# Patient Record
Sex: Male | Born: 1955 | Race: White | Hispanic: No | Marital: Married | Smoking: Former smoker
Health system: Southern US, Community
[De-identification: ages and names within clinical notes are randomized; demographics above are authoritative.]

## PROBLEM LIST (undated history)

## (undated) DIAGNOSIS — M1990 Unspecified osteoarthritis, unspecified site: Secondary | ICD-10-CM

## (undated) DIAGNOSIS — Z95810 Presence of automatic (implantable) cardiac defibrillator: Secondary | ICD-10-CM

## (undated) DIAGNOSIS — T82190A Other mechanical complication of cardiac electrode, initial encounter: Secondary | ICD-10-CM

## (undated) DIAGNOSIS — Z9989 Dependence on other enabling machines and devices: Secondary | ICD-10-CM

## (undated) DIAGNOSIS — M5090 Cervical disc disorder, unspecified, unspecified cervical region: Secondary | ICD-10-CM

## (undated) DIAGNOSIS — K760 Fatty (change of) liver, not elsewhere classified: Secondary | ICD-10-CM

## (undated) DIAGNOSIS — N400 Enlarged prostate without lower urinary tract symptoms: Secondary | ICD-10-CM

## (undated) DIAGNOSIS — I10 Essential (primary) hypertension: Secondary | ICD-10-CM

## (undated) DIAGNOSIS — I429 Cardiomyopathy, unspecified: Secondary | ICD-10-CM

## (undated) DIAGNOSIS — R911 Solitary pulmonary nodule: Secondary | ICD-10-CM

## (undated) DIAGNOSIS — E782 Mixed hyperlipidemia: Secondary | ICD-10-CM

## (undated) DIAGNOSIS — I25119 Atherosclerotic heart disease of native coronary artery with unspecified angina pectoris: Secondary | ICD-10-CM

## (undated) DIAGNOSIS — Z129 Encounter for screening for malignant neoplasm, site unspecified: Secondary | ICD-10-CM

## (undated) DIAGNOSIS — I252 Old myocardial infarction: Secondary | ICD-10-CM

## (undated) DIAGNOSIS — I499 Cardiac arrhythmia, unspecified: Secondary | ICD-10-CM

## (undated) DIAGNOSIS — K219 Gastro-esophageal reflux disease without esophagitis: Secondary | ICD-10-CM

## (undated) DIAGNOSIS — J449 Chronic obstructive pulmonary disease, unspecified: Secondary | ICD-10-CM

## (undated) DIAGNOSIS — G4762 Sleep related leg cramps: Secondary | ICD-10-CM

## (undated) DIAGNOSIS — I2510 Atherosclerotic heart disease of native coronary artery without angina pectoris: Secondary | ICD-10-CM

## (undated) DIAGNOSIS — I493 Ventricular premature depolarization: Secondary | ICD-10-CM

## (undated) DIAGNOSIS — E1142 Type 2 diabetes mellitus with diabetic polyneuropathy: Secondary | ICD-10-CM

## (undated) DIAGNOSIS — E669 Obesity, unspecified: Secondary | ICD-10-CM

## (undated) DIAGNOSIS — E785 Hyperlipidemia, unspecified: Secondary | ICD-10-CM

## (undated) DIAGNOSIS — G4733 Obstructive sleep apnea (adult) (pediatric): Secondary | ICD-10-CM

## (undated) DIAGNOSIS — D649 Anemia, unspecified: Secondary | ICD-10-CM

## (undated) DIAGNOSIS — I5022 Chronic systolic (congestive) heart failure: Secondary | ICD-10-CM

## (undated) DIAGNOSIS — I255 Ischemic cardiomyopathy: Principal | ICD-10-CM

## (undated) DIAGNOSIS — Z79899 Other long term (current) drug therapy: Secondary | ICD-10-CM

## (undated) DIAGNOSIS — G4700 Insomnia, unspecified: Secondary | ICD-10-CM

## (undated) DIAGNOSIS — E559 Vitamin D deficiency, unspecified: Secondary | ICD-10-CM

## (undated) DIAGNOSIS — Z8674 Personal history of sudden cardiac arrest: Secondary | ICD-10-CM

## (undated) HISTORY — DX: Anemia, unspecified: D64.9

## (undated) HISTORY — PX: CARDIAC CATHETERIZATION: SHX172

## (undated) HISTORY — DX: Fatty (change of) liver, not elsewhere classified: K76.0

## (undated) HISTORY — DX: Hyperlipidemia, unspecified: E78.5

## (undated) HISTORY — DX: Cervical disc disorder, unspecified, unspecified cervical region: M50.90

## (undated) HISTORY — DX: Encounter for screening for malignant neoplasm, site unspecified: Z12.9

## (undated) HISTORY — DX: Benign prostatic hyperplasia without lower urinary tract symptoms: N40.0

## (undated) HISTORY — DX: Ischemic cardiomyopathy: I25.5

## (undated) HISTORY — DX: Type 2 diabetes mellitus with diabetic polyneuropathy: E11.42

## (undated) HISTORY — DX: Atherosclerotic heart disease of native coronary artery without angina pectoris: I25.10

## (undated) HISTORY — DX: Other mechanical complication of cardiac electrode, initial encounter: T82.190A

## (undated) HISTORY — DX: Atherosclerotic heart disease of native coronary artery with unspecified angina pectoris: I25.119

## (undated) HISTORY — DX: Other long term (current) drug therapy: Z79.899

## (undated) HISTORY — DX: Mixed hyperlipidemia: E78.2

## (undated) HISTORY — DX: Presence of automatic (implantable) cardiac defibrillator: Z95.810

## (undated) HISTORY — DX: Chronic obstructive pulmonary disease, unspecified: J44.9

## (undated) HISTORY — DX: Obstructive sleep apnea (adult) (pediatric): G47.33

## (undated) HISTORY — DX: Cardiomyopathy, unspecified: I42.9

## (undated) HISTORY — DX: Ventricular premature depolarization: I49.3

## (undated) HISTORY — DX: Dependence on other enabling machines and devices: Z99.89

## (undated) HISTORY — DX: Personal history of sudden cardiac arrest: Z86.74

## (undated) HISTORY — PX: INSERT / REPLACE / REMOVE PACEMAKER: SUR710

## (undated) HISTORY — DX: Insomnia, unspecified: G47.00

## (undated) HISTORY — DX: Sleep related leg cramps: G47.62

## (undated) HISTORY — DX: Obesity, unspecified: E66.9

## (undated) HISTORY — DX: Essential (primary) hypertension: I10

## (undated) HISTORY — DX: Cardiac arrhythmia, unspecified: I49.9

## (undated) HISTORY — DX: Chronic systolic (congestive) heart failure: I50.22

## (undated) HISTORY — DX: Vitamin D deficiency, unspecified: E55.9

## (undated) HISTORY — DX: Gastro-esophageal reflux disease without esophagitis: K21.9

## (undated) HISTORY — PX: UMBILICAL HERNIA REPAIR: SHX196

## (undated) HISTORY — DX: Old myocardial infarction: I25.2

## (undated) HISTORY — PX: KNEE SURGERY: SHX244

## (undated) HISTORY — DX: Unspecified osteoarthritis, unspecified site: M19.90

## (undated) HISTORY — DX: Solitary pulmonary nodule: R91.1

---

## 2007-03-23 DIAGNOSIS — I5040 Unspecified combined systolic (congestive) and diastolic (congestive) heart failure: Secondary | ICD-10-CM

## 2007-03-23 HISTORY — DX: Unspecified combined systolic (congestive) and diastolic (congestive) heart failure: I50.40

## 2014-04-16 ENCOUNTER — Ambulatory Visit (INDEPENDENT_AMBULATORY_CARE_PROVIDER_SITE_OTHER): Payer: Commercial Managed Care - HMO | Admitting: Internal Medicine

## 2014-04-16 ENCOUNTER — Encounter: Payer: Self-pay | Admitting: Internal Medicine

## 2014-04-16 VITALS — BP 100/68 | HR 81 | Ht 72.0 in | Wt 244.6 lb

## 2014-04-16 DIAGNOSIS — G4733 Obstructive sleep apnea (adult) (pediatric): Secondary | ICD-10-CM

## 2014-04-16 DIAGNOSIS — I2510 Atherosclerotic heart disease of native coronary artery without angina pectoris: Secondary | ICD-10-CM | POA: Diagnosis not present

## 2014-04-16 DIAGNOSIS — Z6831 Body mass index (BMI) 31.0-31.9, adult: Secondary | ICD-10-CM | POA: Insufficient documentation

## 2014-04-16 DIAGNOSIS — Z95810 Presence of automatic (implantable) cardiac defibrillator: Secondary | ICD-10-CM

## 2014-04-16 DIAGNOSIS — I255 Ischemic cardiomyopathy: Secondary | ICD-10-CM | POA: Diagnosis not present

## 2014-04-16 DIAGNOSIS — I110 Hypertensive heart disease with heart failure: Secondary | ICD-10-CM | POA: Insufficient documentation

## 2014-04-16 DIAGNOSIS — Z8674 Personal history of sudden cardiac arrest: Secondary | ICD-10-CM | POA: Diagnosis not present

## 2014-04-16 DIAGNOSIS — I25119 Atherosclerotic heart disease of native coronary artery with unspecified angina pectoris: Secondary | ICD-10-CM | POA: Insufficient documentation

## 2014-04-16 DIAGNOSIS — I10 Essential (primary) hypertension: Secondary | ICD-10-CM

## 2014-04-16 DIAGNOSIS — E785 Hyperlipidemia, unspecified: Secondary | ICD-10-CM

## 2014-04-16 DIAGNOSIS — E669 Obesity, unspecified: Secondary | ICD-10-CM

## 2014-04-16 DIAGNOSIS — E66811 Obesity, class 1: Secondary | ICD-10-CM

## 2014-04-16 DIAGNOSIS — Z9989 Dependence on other enabling machines and devices: Secondary | ICD-10-CM

## 2014-04-16 DIAGNOSIS — I2583 Coronary atherosclerosis due to lipid rich plaque: Secondary | ICD-10-CM

## 2014-04-16 HISTORY — DX: Presence of automatic (implantable) cardiac defibrillator: Z95.810

## 2014-04-16 HISTORY — DX: Atherosclerotic heart disease of native coronary artery without angina pectoris: I25.10

## 2014-04-16 HISTORY — DX: Obesity, unspecified: E66.9

## 2014-04-16 HISTORY — DX: Obstructive sleep apnea (adult) (pediatric): G47.33

## 2014-04-16 HISTORY — DX: Personal history of sudden cardiac arrest: Z86.74

## 2014-04-16 HISTORY — DX: Hypertensive heart disease with heart failure: I11.0

## 2014-04-16 HISTORY — DX: Ischemic cardiomyopathy: I25.5

## 2014-04-16 HISTORY — DX: Atherosclerotic heart disease of native coronary artery with unspecified angina pectoris: I25.119

## 2014-04-16 HISTORY — DX: Obesity, class 1: E66.811

## 2014-04-16 HISTORY — DX: Essential (primary) hypertension: I10

## 2014-04-16 HISTORY — DX: Hyperlipidemia, unspecified: E78.5

## 2014-04-16 NOTE — Progress Notes (Signed)
OFFICE NOTE  Chief Complaint:  Establish new cardiologist  Primary Care Physician: Feliciana RossettiGRISSO,GREG, MD  HPI:  Austin Bush is a pleasant 59 year old male who was previously cared for by Dr. Dulce SellarMunley and Dr. Rudolpho SevinAkbary at [facility]. Per his report he has a history of coronary artery disease and had a massive MI in June 2009. He called this a "widow maker". He has a history of one stent presumably in the LAD although past records are not yet available. He reports having a reduced EF to 25% based on a recent stress test about 8 months ago. He underwent defibrillator placement and has had problems with inappropriate shocks. He's had 2-lead replacements in the past and a generator change. He is currently going to undergo another generator change out and lead replacement, with a plan to move the defibrilator device from the left upper chest to the right upper chest. He is left-handed. Currently he has a Biotronik device, however he may be switching out to a [employer] development officerdifferent manufacturer. This scheduled generator change out is next Monday. Subsequent to this generator change and lead revision, he wishes to establish cardiac vascular care with [facility]. Currently denies any chest pain or worsening shortness of breath. He reports his home dry weight is 238 pounds. He denies any new or worsening edema. He does have obstructive sleep apnea and uses CPAP.  PMHx:  Past Medical History  Diagnosis Date  . Cardiomyopathy, ischemic 04/16/2014    EF 25%   . History of sudden cardiac arrest 04/16/2014    08/2007   . CAD (coronary artery disease) 04/16/2014    PCI to the LAD   . S/P implantation of automatic cardioverter/defibrillator (AICD) 04/16/2014    Biotronik - history of inappropriate shocks, at least 2 lead replacements   . Dyslipidemia 04/16/2014  . Essential hypertension 04/16/2014  . Obesity (BMI 30.0-34.9) 04/16/2014  . OSA on CPAP 04/16/2014    Past Surgical History    Procedure Laterality Date  . Cardiac catheterization    . Insert / replace / remove pacemaker      Biotronik AICD    FAMHx:  Family History  Problem Relation Age of Onset  . Lung cancer Father   . Heart attack Paternal Grandfather     SOCHx:   reports that he has quit smoking. He has never used smokeless tobacco. He reports that he does not drink alcohol. His drug history is not on file.  ALLERGIES:  No Known Allergies  ROS: A comprehensive review of systems was negative except for: Respiratory: positive for dyspnea on exertion  HOME MEDS: Current Outpatient Prescriptions  Medication Sig Dispense Refill  . aspirin 81 MG tablet Take 81 mg by mouth daily.    . carvedilol (COREG) 25 MG tablet Take 25 mg by mouth 2 (two) times daily with a meal.    . Cholecalciferol 1000 UNITS capsule Take 1,000 Units by mouth daily.    . furosemide (LASIX) 40 MG tablet Take 40 mg by mouth 2 (two) times daily.    Marland Kitchen. gabapentin (NEURONTIN) 400 MG capsule Take 400 mg by mouth 3 (three) times daily.  1  . ketoconazole (NIZORAL) 2 % cream Apply topically 2 (two) times daily.  2  . lisinopril (PRINIVIL,ZESTRIL) 20 MG tablet Take 20 mg by mouth 2 (two) times daily.    . metFORMIN (GLUCOPHAGE) 500 MG tablet Take 500 mg by mouth 2 (two) times daily.    . Multiple Vitamin (MULTIVITAMIN) tablet  Take 1 tablet by mouth daily.    . Omega-3 Fatty Acids (FISH OIL) 1200 MG CPDR Take 1,200 mg by mouth 3 (three) times daily.    . potassium chloride SA (K-DUR,KLOR-CON) 20 MEQ tablet Take 20 mEq by mouth 2 (two) times daily.    . pravastatin (PRAVACHOL) 40 MG tablet Take 40 mg by mouth daily.     No current facility-administered medications for this visit.    LABS/IMAGING: No results found for this or any previous visit (from the past 48 hour(s)). No results found.  VITALS: BP 100/68 mmHg  Pulse 81  Ht 6' (1.829 m)  Wt 244 lb 9.6 oz (110.95 kg)  BMI 33.17 kg/m2  EXAM: General appearance: alert and no  distress Neck: no carotid bruit and no JVD Lungs: clear to auscultation bilaterally Heart: regular rate and rhythm, S1, S2 normal, no murmur, click, rub or gallop Abdomen: soft, non-tender; bowel sounds normal; no masses,  no organomegaly and obese Extremities: extremities normal, atraumatic, no cyanosis or edema Pulses: 2+ and symmetric Skin: Skin color, texture, turgor normal. No rashes or lesions Neurologic: Grossly normal Psych: Pleasant, normal mood, affect  EKG: Normal sinus rhythm at 81, small inferior Q waves  ASSESSMENT: 1. Ischemic cardiopathy, EF 25%, NYHA class II symptoms 2. Coronary artery disease status post PCI of the LAD 3. Status post AICD for aborted sudden cardiac death-Biotronik 4. Hypertension 5. Dyslipidemia 6. Obstructive sleep apnea on CPAP 7. Obesity  PLAN: 1.   [name] wishes to establish care with [facility]. Recent defibrillator interrogations of indicated problems with the leads and he may end up getting new leads and/or a new device in a change her procedure next Monday. He wishes to stick with that scheduled appointment and then switches service over our group. He appears to be euvolemic with at best class II symptoms. His current medication regimen is appropriate for heart failure. He is not currently on Aldactone, which may additionally be added to decrease mortality and improved symptoms as per the RALES trial.  We will go ahead and request records from his cardiologist and electrophysiologist and schedule him for follow-up in our device clinic as well as an appointment to see Dr. Salena Saner for ongoing evaluation of his new device.  Chrystie Nose, MD, [facility] Attending Cardiologist [facility]  Tashawnda Bleiler C 04/16/2014, 11:46 AM

## 2014-04-16 NOTE — Patient Instructions (Signed)
Your physician recommends that you schedule a follow-up appointment in 3 months with Dr. Royann Shiversroitoru for device clinic  Your physician wants you to follow-up in: 6 months with Dr. Rennis GoldenHilty. You will receive a reminder letter in the mail two months in advance. If you don't receive a letter, please call our office to schedule the follow-up appointment.

## 2014-04-24 ENCOUNTER — Telehealth: Payer: Self-pay | Admitting: Internal Medicine

## 2014-04-24 NOTE — Telephone Encounter (Signed)
Faxed signed release from patient to [facility] in [place] and [facility] in [place].  FAxed on 04/24/14. lp

## 2014-05-02 ENCOUNTER — Ambulatory Visit: Payer: Self-pay | Admitting: Internal Medicine

## 2014-05-15 ENCOUNTER — Telehealth: Payer: Self-pay | Admitting: Cardiovascular Disease

## 2014-05-15 NOTE — Telephone Encounter (Signed)
Received records from [facility] for appointment with Dr Royann Shiversroitoru 07/16/14.  [facility] for Dr Croitoru's schedule on 07/16/14.  lp

## 2014-07-16 ENCOUNTER — Encounter: Payer: Medicare HMO | Admitting: Cardiovascular Disease

## 2014-12-12 DIAGNOSIS — I429 Cardiomyopathy, unspecified: Secondary | ICD-10-CM | POA: Insufficient documentation

## 2014-12-12 HISTORY — DX: Cardiomyopathy, unspecified: I42.9

## 2015-04-23 DIAGNOSIS — E785 Hyperlipidemia, unspecified: Secondary | ICD-10-CM

## 2015-04-23 DIAGNOSIS — M1710 Unilateral primary osteoarthritis, unspecified knee: Secondary | ICD-10-CM

## 2015-04-23 DIAGNOSIS — R911 Solitary pulmonary nodule: Secondary | ICD-10-CM

## 2015-04-23 DIAGNOSIS — T82190A Other mechanical complication of cardiac electrode, initial encounter: Secondary | ICD-10-CM

## 2015-04-23 DIAGNOSIS — K635 Polyp of colon: Secondary | ICD-10-CM

## 2015-04-23 DIAGNOSIS — M50122 Cervical disc disorder at C5-C6 level with radiculopathy: Secondary | ICD-10-CM

## 2015-04-23 DIAGNOSIS — J449 Chronic obstructive pulmonary disease, unspecified: Secondary | ICD-10-CM | POA: Insufficient documentation

## 2015-04-23 DIAGNOSIS — M1990 Unspecified osteoarthritis, unspecified site: Secondary | ICD-10-CM | POA: Insufficient documentation

## 2015-04-23 DIAGNOSIS — N400 Enlarged prostate without lower urinary tract symptoms: Secondary | ICD-10-CM

## 2015-04-23 DIAGNOSIS — Z129 Encounter for screening for malignant neoplasm, site unspecified: Secondary | ICD-10-CM | POA: Insufficient documentation

## 2015-04-23 DIAGNOSIS — M5090 Cervical disc disorder, unspecified, unspecified cervical region: Secondary | ICD-10-CM

## 2015-04-23 DIAGNOSIS — Z95810 Presence of automatic (implantable) cardiac defibrillator: Secondary | ICD-10-CM

## 2015-04-23 DIAGNOSIS — K219 Gastro-esophageal reflux disease without esophagitis: Secondary | ICD-10-CM | POA: Insufficient documentation

## 2015-04-23 DIAGNOSIS — M179 Osteoarthritis of knee, unspecified: Secondary | ICD-10-CM

## 2015-04-23 DIAGNOSIS — I5022 Chronic systolic (congestive) heart failure: Secondary | ICD-10-CM

## 2015-04-23 DIAGNOSIS — E559 Vitamin D deficiency, unspecified: Secondary | ICD-10-CM

## 2015-04-23 DIAGNOSIS — G629 Polyneuropathy, unspecified: Secondary | ICD-10-CM

## 2015-04-23 DIAGNOSIS — Z79899 Other long term (current) drug therapy: Secondary | ICD-10-CM

## 2015-04-23 DIAGNOSIS — G4700 Insomnia, unspecified: Secondary | ICD-10-CM

## 2015-04-23 DIAGNOSIS — K760 Fatty (change of) liver, not elsewhere classified: Secondary | ICD-10-CM

## 2015-04-23 DIAGNOSIS — I499 Cardiac arrhythmia, unspecified: Secondary | ICD-10-CM

## 2015-04-23 HISTORY — DX: Other mechanical complication of cardiac electrode, initial encounter: T82.190A

## 2015-04-23 HISTORY — DX: Polyneuropathy, unspecified: G62.9

## 2015-04-23 HISTORY — DX: Benign prostatic hyperplasia without lower urinary tract symptoms: N40.0

## 2015-04-23 HISTORY — DX: Unspecified osteoarthritis, unspecified site: M19.90

## 2015-04-23 HISTORY — DX: Cervical disc disorder, unspecified, unspecified cervical region: M50.90

## 2015-04-23 HISTORY — DX: Vitamin D deficiency, unspecified: E55.9

## 2015-04-23 HISTORY — DX: Chronic systolic (congestive) heart failure: I50.22

## 2015-04-23 HISTORY — DX: Other long term (current) drug therapy: Z79.899

## 2015-04-23 HISTORY — DX: Insomnia, unspecified: G47.00

## 2015-04-23 HISTORY — DX: Polyp of colon: K63.5

## 2015-04-23 HISTORY — DX: Cervical disc disorder at C5-C6 level with radiculopathy: M50.122

## 2015-04-23 HISTORY — DX: Solitary pulmonary nodule: R91.1

## 2015-04-23 HISTORY — DX: Encounter for screening for malignant neoplasm, site unspecified: Z12.9

## 2015-04-23 HISTORY — DX: Unilateral primary osteoarthritis, unspecified knee: M17.10

## 2015-04-23 HISTORY — DX: Chronic obstructive pulmonary disease, unspecified: J44.9

## 2015-04-23 HISTORY — DX: Hyperlipidemia, unspecified: E78.5

## 2015-04-23 HISTORY — DX: Gastro-esophageal reflux disease without esophagitis: K21.9

## 2015-04-23 HISTORY — DX: Osteoarthritis of knee, unspecified: M17.9

## 2015-04-23 HISTORY — DX: Fatty (change of) liver, not elsewhere classified: K76.0

## 2015-04-23 HISTORY — DX: Presence of automatic (implantable) cardiac defibrillator: Z95.810

## 2015-04-23 HISTORY — DX: Cardiac arrhythmia, unspecified: I49.9

## 2015-04-24 DIAGNOSIS — E1142 Type 2 diabetes mellitus with diabetic polyneuropathy: Secondary | ICD-10-CM

## 2015-04-24 HISTORY — DX: Type 2 diabetes mellitus with diabetic polyneuropathy: E11.42

## 2015-05-20 DIAGNOSIS — I5042 Chronic combined systolic (congestive) and diastolic (congestive) heart failure: Secondary | ICD-10-CM

## 2015-05-20 HISTORY — DX: Chronic combined systolic (congestive) and diastolic (congestive) heart failure: I50.42

## 2015-09-15 ENCOUNTER — Ambulatory Visit (INDEPENDENT_AMBULATORY_CARE_PROVIDER_SITE_OTHER): Payer: Medicare HMO | Admitting: Podiatry

## 2015-09-15 ENCOUNTER — Ambulatory Visit (INDEPENDENT_AMBULATORY_CARE_PROVIDER_SITE_OTHER): Payer: Medicare HMO

## 2015-09-15 ENCOUNTER — Encounter: Payer: Self-pay | Admitting: Podiatry

## 2015-09-15 VITALS — BP 110/60 | HR 90 | Resp 14

## 2015-09-15 DIAGNOSIS — R52 Pain, unspecified: Secondary | ICD-10-CM | POA: Diagnosis not present

## 2015-09-15 DIAGNOSIS — M722 Plantar fascial fibromatosis: Secondary | ICD-10-CM | POA: Diagnosis not present

## 2015-09-15 MED ORDER — MELOXICAM 15 MG PO TABS: 15.0000 mg | ORAL_TABLET | Freq: Every day | ORAL | Status: DC

## 2015-09-15 NOTE — Progress Notes (Signed)
   Subjective:    Patient ID: Austin Bush, male    DOB: 08/30/1955, 60 y.o.   MRN: [id number]  HPI    Review of Systems  All other systems reviewed and are negative.      Objective:   Physical Exam        Assessment & Plan:

## 2015-09-15 NOTE — Progress Notes (Signed)
Subjective:     Patient ID: Austin Bush, male   DOB: 03/17/1956, 60 y.o.   MRN: [id number]  HPI this patient presents the office with chief complaint of pain in the bottom of both feet and through his feet for the last 2 years. He says the pain seems to be worsening and he still works and stands at work. He says the pain extends from the heel all the way to the forefoot where he is also experiences pain and discomfort. He has provided no self treatment but did present to good feet and says the insoles there  were helpful.  This patient is diabetic and he takes metformin and gabapentin for neuropathy. He presents concerned about his pain and believes and questions if it is neuropathy pain that he's been experiencing presents the office today for an evaluation and treatment of his feet   Review of Systems     Objective:   Physical Exam GENERAL APPEARANCE: Alert, conversant. Appropriately groomed. No acute distress.  VASCULAR: Pedal pulses are  palpable at  [facility] and PT bilateral.  Capillary refill time is immediate to all digits,  Normal temperature gradient.  Digital hair growth is present bilateral  NEUROLOGIC: sensation is normal to 5.07 monofilament at 5/5 sites bilateral.  Light touch is intact bilateral, Muscle strength normal.  MUSCULOSKELETAL: acceptable muscle strength, tone and stability bilateral.  Intrinsic muscluature intact bilateral.  Rectus appearance of foot and digits noted bilateral. Palpable pain through the arch of both feet.  HAV 1st MPJ  B/L  DERMATOLOGIC: skin color, texture, and turgor are within normal limits.  No preulcerative lesions or ulcers  are seen, no interdigital maceration noted.  No open lesions present.  Digital nails are asymptomatic. No drainage noted. Thick disfigured discolored nails both feet.      Assessment:     Plantar fascitis B/L  Midfoot arthritis.  HAV 1st MPJ  B/L    Plan:    IE  X-rays were taken which reveal calcification at the insertion of  the plantar fascia as well as the Achilles. There are arthritic changes noted in the midfoot bilateral. Patient will be dispensed power steps to be worn in all his shoes. Prescription for Mobic 15 mg be dispensed.  Return to the clinic in 2 weeks for evaluation and treatment. Discussed the blood pressure with this patient and he says his doctor keeps it very low on purpose.  Austin Bush DPM

## 2015-09-29 ENCOUNTER — Encounter: Payer: Self-pay | Admitting: Podiatry

## 2015-09-29 ENCOUNTER — Ambulatory Visit (INDEPENDENT_AMBULATORY_CARE_PROVIDER_SITE_OTHER): Payer: Medicare HMO | Admitting: Podiatry

## 2015-09-29 ENCOUNTER — Ambulatory Visit: Payer: Medicare HMO | Admitting: Podiatry

## 2015-09-29 VITALS — BP 103/63 | HR 77 | Resp 14

## 2015-09-29 DIAGNOSIS — M722 Plantar fascial fibromatosis: Secondary | ICD-10-CM | POA: Diagnosis not present

## 2015-09-29 NOTE — Progress Notes (Signed)
Subjective:     Patient ID: Austin Bush, male   DOB: 01/14/1956, 60 y.o.   MRN: [id number]  HPI this patient returns to the office after being diagnosed as having plantar fasciitis bilateral. He says that he was working at [employer] and told his boss. He needed to probably quit due to the severity of pain in his feet. He was evaluated in my office and was prescribed multiple diabetic as well as dispensed power steps. He now says that he is 80% improved and he does not talk about quitting his job anymore. He is very pleased with his progress at this time   Review of Systems     Objective:   Physical Exam GENERAL APPEARANCE: Alert, conversant. Appropriately groomed. No acute distress.  VASCULAR: Pedal pulses are  palpable at  [facility] and PT bilateral.  Capillary refill time is immediate to all digits,  Normal temperature gradient.  Digital hair growth is present bilateral  NEUROLOGIC: sensation is normal to 5.07 monofilament at 5/5 sites bilateral.  Light touch is intact bilateral, Muscle strength normal.  MUSCULOSKELETAL: acceptable muscle strength, tone and stability bilateral.  Intrinsic muscluature intact bilateral.  Rectus appearance of foot and digits noted bilateral. Pain through the arch has resolved.  HAV 1st MPJ  B/L.  DERMATOLOGIC: skin color, texture, and turgor are within normal limits.  No preulcerative lesions or ulcers  are seen, no interdigital maceration noted.  No open lesions present.  Digital nails are asymptomatic. No drainage noted.      Assessment:     Plantar fascitis B/L  Midfoot arthritis  HAV 1st MPJ  B/L     Plan:     ROV>  Told to continue with Mobic and using powerstep insoles.  RTC prn   Helane GuntherGregory Azusena Erlandson DPM

## 2015-09-29 NOTE — Addendum Note (Signed)
Addended by: Harlon FlorSOUTHERLAND, Camarion Weier L on: 09/29/2015 04:28 PM   Modules accepted: Medications

## 2015-10-14 ENCOUNTER — Telehealth: Payer: Self-pay | Admitting: *Deleted

## 2015-10-14 MED ORDER — MELOXICAM 15 MG PO TABS: 15.0000 mg | ORAL_TABLET | Freq: Every day | ORAL | 1 refills | Status: DC

## 2015-10-14 NOTE — Telephone Encounter (Signed)
Refill request fax received for Meloxicam.  Dr. Lanney Gins refill +1.

## 2016-02-27 DIAGNOSIS — E782 Mixed hyperlipidemia: Secondary | ICD-10-CM

## 2016-02-27 DIAGNOSIS — I10 Essential (primary) hypertension: Secondary | ICD-10-CM | POA: Insufficient documentation

## 2016-02-27 DIAGNOSIS — I493 Ventricular premature depolarization: Secondary | ICD-10-CM | POA: Insufficient documentation

## 2016-02-27 HISTORY — DX: Mixed hyperlipidemia: E78.2

## 2016-02-27 HISTORY — DX: Ventricular premature depolarization: I49.3

## 2016-09-09 DIAGNOSIS — I5042 Chronic combined systolic (congestive) and diastolic (congestive) heart failure: Secondary | ICD-10-CM | POA: Insufficient documentation

## 2016-09-09 DIAGNOSIS — I252 Old myocardial infarction: Secondary | ICD-10-CM

## 2016-09-09 DIAGNOSIS — I5022 Chronic systolic (congestive) heart failure: Secondary | ICD-10-CM

## 2016-09-09 HISTORY — DX: Chronic systolic (congestive) heart failure: I50.22

## 2016-09-09 HISTORY — DX: Old myocardial infarction: I25.2

## 2016-09-15 DIAGNOSIS — G4762 Sleep related leg cramps: Secondary | ICD-10-CM | POA: Insufficient documentation

## 2016-09-15 DIAGNOSIS — Z79899 Other long term (current) drug therapy: Secondary | ICD-10-CM

## 2016-09-15 HISTORY — DX: Sleep related leg cramps: G47.62

## 2016-09-15 HISTORY — DX: Other long term (current) drug therapy: Z79.899

## 2016-09-17 DIAGNOSIS — D500 Iron deficiency anemia secondary to blood loss (chronic): Secondary | ICD-10-CM | POA: Insufficient documentation

## 2016-09-17 DIAGNOSIS — D649 Anemia, unspecified: Secondary | ICD-10-CM

## 2016-09-17 HISTORY — DX: Iron deficiency anemia secondary to blood loss (chronic): D50.0

## 2016-09-17 HISTORY — DX: Anemia, unspecified: D64.9

## 2016-10-12 ENCOUNTER — Ambulatory Visit: Payer: Medicare HMO | Admitting: Cardiology

## 2016-10-26 ENCOUNTER — Ambulatory Visit (INDEPENDENT_AMBULATORY_CARE_PROVIDER_SITE_OTHER): Payer: Medicare HMO | Admitting: Cardiology

## 2016-10-26 ENCOUNTER — Encounter: Payer: Self-pay | Admitting: Cardiology

## 2016-10-26 VITALS — BP 118/74 | HR 66 | Ht 72.0 in | Wt 220.4 lb

## 2016-10-26 DIAGNOSIS — Z95810 Presence of automatic (implantable) cardiac defibrillator: Secondary | ICD-10-CM | POA: Diagnosis not present

## 2016-10-26 DIAGNOSIS — I493 Ventricular premature depolarization: Secondary | ICD-10-CM

## 2016-10-26 DIAGNOSIS — I5022 Chronic systolic (congestive) heart failure: Secondary | ICD-10-CM

## 2016-10-26 DIAGNOSIS — E785 Hyperlipidemia, unspecified: Secondary | ICD-10-CM | POA: Diagnosis not present

## 2016-10-26 DIAGNOSIS — I25119 Atherosclerotic heart disease of native coronary artery with unspecified angina pectoris: Secondary | ICD-10-CM

## 2016-10-26 DIAGNOSIS — I10 Essential (primary) hypertension: Secondary | ICD-10-CM

## 2016-10-26 MED ORDER — FUROSEMIDE 40 MG PO TABS
40.0000 mg | ORAL_TABLET | Freq: Two times a day (BID) | ORAL | 3 refills | Status: DC
Start: 2016-10-26 — End: 2018-05-16

## 2016-10-26 NOTE — Patient Instructions (Addendum)
Medication Instructions:  Your physician has recommended you make the following change in your medication:  CHANGE furosemide (Lasix) to 80 mg in the morning and 40 mg in the afternoon only if weight is greater than 206 pounds.   Labwork: None  Testing/Procedures: None  Follow-Up: Your physician recommends that you schedule a follow-up appointment in: 3 months.  You will receive a phone call to set up an appointment with Dr. Camnitz-electrophysiology   Any Other Special Instructions Will Be Listed Below (If Applicable).     If you need a refill on your cardiac medications before your next appointment, please call your pharmacy.    KNOW YOUR HEART FAILURE ZONES  Green Zone (Your Goal):  Marland Kitchen. No shortness of breath or trouble bleeding . No weight gain of more than 3 pounds in one day or 5 pounds in a week . No swelling in your ankles, feet, stomach, or hands . No chest discomfort, heaviness or pain  Yellow Zone (Call the Doctor to get help!): Having 1 or more of the following: . Weight gain of 3 pounds in 1 day or 5 pounds in 1 week . More swelling of your feet, ankles, stomach, or hands . It is harder to breath when lying down. You need to sit up . Chest discomfort, heaviness, or pain . You feel more tired or have less energy than normal . New or worsening dizziness . Dry hacking cough . You feel uneasy and you know something is not right  Red Zone (Call 911-EMERGENCY): . Struggling to breathe. This does not go away when you sit up. . Stronger and more regular amounts of chest discomfort . New confusion or can't think clearly . Fainting or near fainting   Resources form the American heart Association: 1122334455http://[email or web address]/HEARTORG/[email or web address]-Heart-Failure-Toolkit_[email or web address]

## 2016-10-26 NOTE — Progress Notes (Signed)
Cardiology Office Note:    Date:  10/26/2016   ID:  Austin Bush, DOB 01/07/1956, MRN [id number]  PCP:  Gordan PaymentGrisso, Greg A., MD  Cardiologist:  Norman HerrlichBrian Munley, MD    Referring MD: Gordan PaymentGrisso, Greg A., MD    ASSESSMENT:    1. Chronic systolic CHF (congestive heart failure), NYHA class 2 (HCC)   2. PVC (premature ventricular contraction)   3. Coronary artery disease involving native coronary artery of native heart with angina pectoris (HCC)   4. Essential hypertension   5. 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   6. Hyperlipidemia, unspecified hyperlipidemia type    PLAN:    In order of problems listed above:  1. Mildly decompensated he'll increase his diuretic and used a weight-based scale. He'll continue medical treatment including ACE inhibitor and beta blocker. 2. Stable asymptomatic continue beta blocker at this time I would not initiate an antiarrhythmic drug 3. Stable continue medical treatment aspirin beta blocker And statin 4.          Stable continue current treatment 5.           Stable he'll be seen in our practice and switch device follow-up 2 CH MG 6.            Stable continue statin   Next appointment: 6 months   Medication Adjustments/Labs and Tests Ordered: Current medicines are reviewed at length with the patient today.  Concerns regarding medicines are outlined above.  No orders of the defined types were placed in this encounter.  No orders of the defined types were placed in this encounter.   Chief Complaint  Patient presents with  . Follow-up    Routine office visit for CAD CHF and ICD  . Shortness of Breath  . Dizziness    History of Present Illness:    Austin Bush is a 61 y.o. male with a hx of SCD/VF, ICD, PVC's, CAD, CHF with most recent EF 45%, Dyslipidemia, HTN, S/P PCI  last seen in January 2018.Marland Kitchen. Compliance with diet, lifestyle and medications: Yes Past Medical History:  Diagnosis Date  . AICD present, double chamber  04/23/2015  . Anemia of unknown etiology 09/17/2016  . BPH (benign prostatic hyperplasia) 04/23/2015  . CAD (coronary artery disease) 04/16/2014   PCI to the LAD   . Cardiac arrhythmia 04/23/2015  . Cardiomyopathy (HCC) 12/12/2014  . Cardiomyopathy, ischemic 04/16/2014   EF 25%   . Cervical disc disease 04/23/2015  . Chronic systolic CHF (congestive heart failure), NYHA class 2 (HCC) 09/09/2016  . COPD (chronic obstructive pulmonary disease) (HCC) 04/23/2015  . Coronary artery disease involving native coronary artery of native heart with angina pectoris (HCC) 04/16/2014   PCI to the LAD  Overview:  a. Cardiac cath 02/03/2012 Severe progression of CAD, albeit with patent stents in the proximal mid LAD and mid to distal circumflex with prior intervention in 2009. Now featuring high-grade ulcerative stenosis at the junction of the proximal and mid RCA, high-grade stenosis at the origin of posterior descending branch involving the very distal portion of the RCA proximal to the bifurcation. 90% stenosis in the mid circumflex above the previously placed stent and 98% stenosis and a large very high takeoff diagonal branch of LAD coronary artery. Very distal LAD apical disease. b. reported catheterization 06/2015 without disease requiring intervention  Overview:  inferoposterior MI June 2009; 12 Sep 2009 with out of hospital SCD, [facility]  Cardiac cath 07/28/15: Conclusions Diagnostic Procedure Summary Mild stenosis of  the Circumflex artery Otherwise no obstructive coronary artery disease. Segmental LV dysfunction of inferior wall -moderate hypokinesis   . Diabetes, polyneuropathy (HCC) 04/24/2015  . 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 Dyslipidemia 04/16/2014  . Encounter for long-term (current) use of high-risk medication 04/23/2015  . Encounter for screening for malignant neoplasm 04/23/2015  .  Essential hypertension 04/16/2014  . Fatty liver 04/23/2015  . Gastro-esophageal reflux 04/23/2015  . High risk medication use 09/15/2016  . History of sudden cardiac arrest 04/16/2014   08/2007   . Implanted defibrillator electrode lead fracture 04/23/2015  . Insomnia 04/23/2015  . Ischemic cardiomyopathy 04/16/2014   EF 25%  Overview:  a. EF of 30% after myocardial infarction and cardiac arrest in 2009.  b. S/P dual-chamber ICD in 2009.  c. S/P ICD replacement and ICD generator change in 2012 due to lead malfunction resulting and 32 ICD shocks.  d. Echo 8/13.2013 TDS. LV chamber size is normal. Global LV wall motion and contractility are within normal limits. Estimated EF 55-60%. RV is mildly dilated. RV global systolic function is mildly reduced. Trace MR. Trace TR.  e. S/P system explant from the left with reimplantation on the right side February 1st 2016.  f. Echo 10/31/2014 LA 4.42cm. EF 25-30%. LV chamber size is mildly dilated. E:A reversal in the MV flow pattern suggestive of diastolic dysfunction. LA is mild to moderately dilated. RV is mildly dilated. RV global systolic function is mildly reduced. Mild AR, MR, TR, trace PR. Trivial pericardial effusion is visualized IVC is dilated.  . Lung nodule 04/23/2015  . Mixed hyperlipidemia 02/27/2016   Overview:  a. Statin therapy.   . Nocturnal leg cramps 09/15/2016  . Obesity (BMI 30.0-34.9) 04/16/2014  . Obstructive sleep apnea syndrome 04/16/2014  . Old MI (myocardial infarction) 09/09/2016  . OSA on CPAP 04/16/2014  . Osteoarthritis 04/23/2015  . PVC (premature ventricular contraction) 02/27/2016  . S/P implantation of automatic cardioverter/defibrillator (AICD) 04/16/2014   Biotronik - history of inappropriate shocks, at least 2 lead replacements   . Vitamin D deficiency 04/23/2015    Past Surgical History:  Procedure Laterality Date  . CARDIAC CATHETERIZATION    . INSERT / REPLACE / REMOVE PACEMAKER     Biotronik AICD  . KNEE SURGERY    . UMBILICAL  HERNIA REPAIR      Current Medications: Current Meds  Medication Sig  . aspirin EC 81 MG tablet Take 162 mg by mouth daily.   . carvedilol (COREG) 12.5 MG tablet Take 6.25 mg by mouth 2 (two) times daily with a meal.   . Cholecalciferol (D 1000) 1000 units capsule Take 1,000 Units by mouth daily.   . furosemide (LASIX) 40 MG tablet Take 40 mg by mouth 2 (two) times daily.  Marland Kitchen gabapentin (NEURONTIN) 400 MG capsule Take 600 mg by mouth 3 (three) times daily.   Marland Kitchen ketoconazole (NIZORAL) 2 % cream Apply topically 2 (two) times daily.  Marland Kitchen lisinopril (PRINIVIL,ZESTRIL) 5 MG tablet Take 5 mg by mouth 2 (two) times daily.  . meloxicam (MOBIC) 15 MG tablet Take 1 tablet (15 mg total) by mouth daily.  . metFORMIN (GLUCOPHAGE) 500 MG tablet Take 500 mg by mouth 2 (two) times daily with a meal.   . methocarbamol (ROBAXIN) 500 MG tablet Take 1,000 mg by mouth 2 (two) times daily.  . Multiple Vitamin (MULTIVITAMIN) tablet Take 1 tablet by mouth daily.  Marland Kitchen  nitroGLYCERIN (NITROSTAT) 0.4 MG SL tablet Place 0.4 mg under the tongue every 5 (five) minutes as needed for chest pain.   . Omega-3 Fatty Acids (FISH OIL) 1200 MG CPDR Take 1,200 mg by mouth 3 (three) times daily.  Marland Kitchen omeprazole (PRILOSEC) 20 MG capsule Take 20 mg by mouth 2 (two) times daily.  . potassium chloride SA (K-DUR,KLOR-CON) 20 MEQ tablet Take 40 mEq by mouth daily.   . pravastatin (PRAVACHOL) 40 MG tablet Take 40 mg by mouth daily.      Allergies:   Patient has no known allergies.   Social History   Social History  . Marital status: Married    Spouse name: N/A  . Number of children: N/A  . Years of education: N/A   Social History Main Topics  . Smoking status: Former Smoker    Years: 30.00  . Smokeless tobacco: Never Used  . Alcohol use 0.6 oz/week    1 Glasses of wine per week  . Drug use: No  . Sexual activity: Not Asked   Other Topics Concern  . None   Social History Narrative  . None     Family History: The patient's  family history includes Heart attack in his paternal grandfather; Hypertension in his father; Lung cancer in his father. ROS:   Please see the history of present illness.    All other systems reviewed and are negative.  EKGs/Labs/Other Studies Reviewed:    The following studies were reviewed today:   Device check 08/09/16: . Dual Biotronik ICD (implantable cardioverter-defibrillator) Yes  . Cardiomyopathy, unspecified type (CMS-HCC)  . Chronic systolic heart failure (CMS-HCC)   DEVICE FINDINGS and PLAN:  NORMAL DEVICE FUNCTION with NO SIGNIFICANT ARRHYTHMIAS  TECHNICAL: Adequate Battery Voltage / Expected Longevity  STABLE lead impedances STABLE sensing and pacing characteristics CLINICAL: Activity Trends Appear Stable NO Sustained Ventricular arrhythmias NO Significant Atrial arrhythmias  Continue Routine Remote Monitoring and Return to device clinic in 6 months      Recent Labs: CMP normal 09/15/16 K 4.2 Cr 0.9 No results found for requested labs within last 8760 hours.  Recent Lipid Panel Chol 150 LDL 76  09/15/16 No results found for: CHOL, TRIG, HDL, CHOLHDL, VLDL, LDLCALC, LDLDIRECT  Physical Exam:    VS:  BP 118/74   Pulse 66   Ht 6' (1.829 m)   Wt 220 lb 6.4 oz (100 kg)   SpO2 97%   BMI 29.89 kg/m     Wt Readings from Last 3 Encounters:  10/26/16 220 lb 6.4 oz (100 kg)  04/16/14 244 lb 9.6 oz (110.9 kg)     GEN:  Well nourished, well developed in no acute distress HEENT: Normal NECK: No JVD; No carotid bruits LYMPHATICS: No lymphadenopathy CARDIAC: RRR, no murmurs, rubs, gallops RESPIRATORY:  Clear to auscultation without rales, wheezing or rhonchi  ABDOMEN: Soft, non-tender, non-distended MUSCULOSKELETAL:  1-2+  edema; No deformity  SKIN: Warm and dry NEUROLOGIC:  Alert and oriented x 3 PSYCHIATRIC:  Normal affect    Signed, Norman Herrlich, MD  10/26/2016 8:48 AM    [facility]

## 2016-11-24 ENCOUNTER — Other Ambulatory Visit: Payer: Self-pay | Admitting: Cardiology

## 2016-12-06 ENCOUNTER — Other Ambulatory Visit: Payer: Self-pay | Admitting: Cardiology

## 2016-12-08 ENCOUNTER — Encounter: Payer: Self-pay | Admitting: Cardiology

## 2016-12-08 ENCOUNTER — Ambulatory Visit (INDEPENDENT_AMBULATORY_CARE_PROVIDER_SITE_OTHER): Payer: Medicare HMO | Admitting: Cardiology

## 2016-12-08 VITALS — BP 103/67 | HR 86 | Ht 72.0 in | Wt 219.4 lb

## 2016-12-08 DIAGNOSIS — I5022 Chronic systolic (congestive) heart failure: Secondary | ICD-10-CM

## 2016-12-08 DIAGNOSIS — Z4502 Encounter for adjustment and management of automatic implantable cardiac defibrillator: Secondary | ICD-10-CM | POA: Diagnosis not present

## 2016-12-08 DIAGNOSIS — I10 Essential (primary) hypertension: Secondary | ICD-10-CM | POA: Diagnosis not present

## 2016-12-08 DIAGNOSIS — E785 Hyperlipidemia, unspecified: Secondary | ICD-10-CM | POA: Diagnosis not present

## 2016-12-08 DIAGNOSIS — I4901 Ventricular fibrillation: Secondary | ICD-10-CM | POA: Diagnosis not present

## 2016-12-08 LAB — CUP PACEART INCLINIC DEVICE CHECK
Brady Statistic RA Percent Paced: 0 %
Brady Statistic RV Percent Paced: 0 %
Date Time Interrogation Session: 20180919162545
HighPow Impedance: 97 Ohm
Implantable Lead Model: 350
Implantable Lead Model: 379
Implantable Lead Serial Number: 49114934
Implantable Pulse Generator Implant Date: 20160201
Lead Channel Pacing Threshold Amplitude: 0.3 V
Lead Channel Pacing Threshold Pulse Width: 0.5 ms
Lead Channel Sensing Intrinsic Amplitude: 18.2 mV
Lead Channel Sensing Intrinsic Amplitude: 6.9 mV
Lead Channel Setting Pacing Amplitude: 2 V
Lead Channel Setting Pacing Amplitude: 2 V
Lead Channel Setting Pacing Pulse Width: 0.5 ms
MDC IDC LEAD IMPLANT DT: 20160201
MDC IDC LEAD IMPLANT DT: 20160201
MDC IDC LEAD LOCATION: 753859
MDC IDC LEAD LOCATION: 753860
MDC IDC LEAD SERIAL: 29736564
MDC IDC MSMT BATTERY VOLTAGE: 3.12 V
MDC IDC MSMT LEADCHNL RA IMPEDANCE VALUE: 621 Ohm
MDC IDC MSMT LEADCHNL RA PACING THRESHOLD PULSEWIDTH: 0.5 ms
MDC IDC MSMT LEADCHNL RV IMPEDANCE VALUE: 577 Ohm
MDC IDC MSMT LEADCHNL RV PACING THRESHOLD AMPLITUDE: 0.9 V
MDC IDC SET LEADCHNL RV SENSING SENSITIVITY: 0.8 mV
Pulse Gen Model: 392412
Pulse Gen Serial Number: 60838040

## 2016-12-08 NOTE — Patient Instructions (Signed)
Medication Instructions:  Your physician recommends that you continue on your current medications as directed. Please refer to the Current Medication list given to you today.  --- If you need a refill on your cardiac medications before your next appointment, please call your pharmacy. ---  Labwork: None ordered  Testing/Procedures: None ordered  Follow-Up: Remote monitoring is used to monitor your Pacemaker of ICD from home. This monitoring reduces the number of office visits required to check your device to one time per year. It allows [place] to keep an eye on the functioning of your device to ensure it is working properly. You are scheduled for a device check from home on 03/09/2017. You may send your transmission at any time that day. If you have a wireless device, the transmission will be sent automatically. After your physician reviews your transmission, you will receive a postcard with your next transmission date.  Your physician wants you to follow-up in: 1 year with Dr. Elberta Fortis.  You will receive a reminder letter in the mail two months in advance. If you don't receive a letter, please call our office to schedule the follow-up appointment.   Any Other Special Instructions Will Be Listed Below (If Applicable).    Thank you for choosing [facility]!!   Dory Horn, RN [phone]

## 2016-12-08 NOTE — Progress Notes (Addendum)
Electrophysiology Office Note   Date:  12/09/2016   ID:  Chancy Smigiel, DOB 1955/10/13, MRN [id number]  PCP:  Gordan Payment., MD  Cardiologist:  Dulce Sellar Primary Electrophysiologist:  Tashiba Timoney Jorja Loa, MD    Chief Complaint  Patient presents with  . Defib Check    Chronic systolic CHF/VFib     History of Present Illness: Austin Bush is a 61 y.o. male who is being seen today for the evaluation of CHF, ICD at the request of Gordan Payment., MD. Presenting today for electrophysiology evaluation. He has a history of coronary artery disease status post PCI to the LAD in 2016, ischemic cardiomyopathy with an EF of 25%, NYHA class II heart failure, COPD, diabetes, hyperlipidemia, hypertension. He had cardiac arrest in 2009 and is status post Biotronik dual-chamber ICD status post ICD replacement and ICD generator change in 2012 due to lead malfunction resulting in inappropriate shocks. Echo 2013 showed an EF of 55-60%. Repeat echo in 2016 showed an EF of 25-30%. Most recent echo showed an EF of 45%.He has had a total of 2-lead revisions. The first was 3 years after his device was implanted, the second was 3 years after that. The left-sided device was abandoned and the device was moved to the right.    Today, he denies symptoms of palpitations, chest pain, shortness of breath, orthopnea, PND, lower extremity edema, claudication, dizziness, presyncope, syncope, bleeding, or neurologic sequela. The patient is tolerating medications without difficulties.    Past Medical History:  Diagnosis Date  . AICD present, double chamber 04/23/2015  . Anemia of unknown etiology 09/17/2016  . BPH (benign prostatic hyperplasia) 04/23/2015  . CAD (coronary artery disease) 04/16/2014   PCI to the LAD   . Cardiac arrhythmia 04/23/2015  . Cardiomyopathy (HCC) 12/12/2014  . Cardiomyopathy, ischemic 04/16/2014   EF 25%   . Cervical disc disease 04/23/2015  . Chronic systolic CHF (congestive heart failure), NYHA  class 2 (HCC) 09/09/2016  . COPD (chronic obstructive pulmonary disease) (HCC) 04/23/2015  . Coronary artery disease involving native coronary artery of native heart with angina pectoris (HCC) 04/16/2014   PCI to the LAD  Overview:  a. Cardiac cath 02/03/2012 Severe progression of CAD, albeit with patent stents in the proximal mid LAD and mid to distal circumflex with prior intervention in 2009. Now featuring high-grade ulcerative stenosis at the junction of the proximal and mid RCA, high-grade stenosis at the origin of posterior descending branch involving the very distal portion of the RCA proximal to the bifurcation. 90% stenosis in the mid circumflex above the previously placed stent and 98% stenosis and a large very high takeoff diagonal branch of LAD coronary artery. Very distal LAD apical disease. b. reported catheterization 06/2015 without disease requiring intervention  Overview:  inferoposterior MI June 2009; 12 Sep 2009 with out of hospital SCD, hypothermia and PCI at [facility]  Cardiac cath 07/28/15: Conclusions Diagnostic Procedure Summary Mild stenosis of the Circumflex artery Otherwise no obstructive coronary artery disease. Segmental LV dysfunction of inferior wall -moderate hypokinesis   . Diabetes, polyneuropathy (HCC) 04/24/2015  . 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 Dyslipidemia 04/16/2014  . Encounter for long-term (current) use of high-risk medication 04/23/2015  . Encounter for screening for malignant neoplasm 04/23/2015  . Essential hypertension 04/16/2014  . Fatty liver 04/23/2015  . Gastro-esophageal reflux 04/23/2015  . High risk medication use 09/15/2016  . History of  sudden cardiac arrest 04/16/2014   08/2007   . Implanted defibrillator electrode lead fracture 04/23/2015  . Insomnia 04/23/2015  . Ischemic cardiomyopathy 04/16/2014   EF 25%  Overview:  a. EF of 30% after myocardial  infarction and cardiac arrest in 2009.  b. S/P dual-chamber ICD in 2009.  c. S/P ICD replacement and ICD generator change in 2012 due to lead malfunction resulting and 32 ICD shocks.  d. Echo 8/13.2013 TDS. LV chamber size is normal. Global LV wall motion and contractility are within normal limits. Estimated EF 55-60%. RV is mildly dilated. RV global systolic function is mildly reduced. Trace MR. Trace TR.  e. S/P system explant from the left with reimplantation on the right side February 1st 2016.  f. Echo 10/31/2014 LA 4.42cm. EF 25-30%. LV chamber size is mildly dilated. E:A reversal in the MV flow pattern suggestive of diastolic dysfunction. LA is mild to moderately dilated. RV is mildly dilated. RV global systolic function is mildly reduced. Mild AR, MR, TR, trace PR. Trivial pericardial effusion is visualized IVC is dilated.  . Lung nodule 04/23/2015  . Mixed hyperlipidemia 02/27/2016   Overview:  a. Statin therapy.   . Nocturnal leg cramps 09/15/2016  . Obesity (BMI 30.0-34.9) 04/16/2014  . Obstructive sleep apnea syndrome 04/16/2014  . Old MI (myocardial infarction) 09/09/2016  . OSA on CPAP 04/16/2014  . Osteoarthritis 04/23/2015  . PVC (premature ventricular contraction) 02/27/2016  . S/P implantation of automatic cardioverter/defibrillator (AICD) 04/16/2014   Biotronik - history of inappropriate shocks, at least 2 lead replacements   . Vitamin D deficiency 04/23/2015   Past Surgical History:  Procedure Laterality Date  . CARDIAC CATHETERIZATION    . INSERT / REPLACE / REMOVE PACEMAKER     Biotronik AICD  . KNEE SURGERY    . UMBILICAL HERNIA REPAIR       Current Outpatient Prescriptions  Medication Sig Dispense Refill  . aspirin EC 81 MG tablet Take 162 mg by mouth daily.     . carvedilol (COREG) 12.5 MG tablet TAKE 1/2 TABLET TWICE DAILY 90 tablet 1  . Cholecalciferol (D 1000) 1000 units capsule Take 1,000 Units by mouth daily.     . furosemide (LASIX) 40 MG tablet Take 1 tablet (40 mg  total) by mouth 2 (two) times daily. Take an extra tablet in the morning if you weigh 206 or greater 270 tablet 3  . gabapentin (NEURONTIN) 400 MG capsule Take 600 mg by mouth 3 (three) times daily.     Marland Kitchen ketoconazole (NIZORAL) 2 % cream Apply topically 2 (two) times daily.  2  . KLOR-CON M20 20 MEQ tablet TAKE 2 TABLETS EVERY DAY 180 tablet 2  . lisinopril (PRINIVIL,ZESTRIL) 5 MG tablet Take 5 mg by mouth 2 (two) times daily.    . meloxicam (MOBIC) 15 MG tablet Take 1 tablet (15 mg total) by mouth daily. 30 tablet 0  . metFORMIN (GLUCOPHAGE) 500 MG tablet Take 500 mg by mouth 2 (two) times daily with a meal.     . methocarbamol (ROBAXIN) 500 MG tablet Take 1,000 mg by mouth 2 (two) times daily.    . Multiple Vitamin (MULTIVITAMIN) tablet Take 1 tablet by mouth daily.    . nitroGLYCERIN (NITROSTAT) 0.4 MG SL tablet Place 0.4 mg under the tongue every 5 (five) minutes as needed for chest pain.     . Omega-3 Fatty Acids (FISH OIL) 1200 MG CPDR Take 1,200 mg by mouth 3 (three) times daily.    Marland Kitchen  omeprazole (PRILOSEC) 20 MG capsule Take 20 mg by mouth 2 (two) times daily.    . pravastatin (PRAVACHOL) 40 MG tablet TAKE 1 TABLET EVERY DAY 90 tablet 3   No current facility-administered medications for this visit.     Allergies:   Patient has no known allergies.   Social History:  The patient  reports that he has quit smoking. He quit after 30.00 years of use. He has never used smokeless tobacco. He reports that he drinks about 0.6 oz of alcohol per week . He reports that he does not use drugs.   Family History:  The patient's family history includes Heart attack in his paternal grandfather; Hypertension in his father; Lung cancer in his father.    ROS:  Please see the history of present illness.   Otherwise, review of systems is positive for none.   All other systems are reviewed and negative.    PHYSICAL EXAM: VS:  BP 103/67   Pulse 86   Ht 6' (1.829 m)   Wt 219 lb 6.4 oz (99.5 kg)   SpO2  95%   BMI 29.76 kg/m  , BMI Body mass index is 29.76 kg/m. GEN: Well nourished, well developed, in no acute distress  HEENT: normal  Neck: no JVD, carotid bruits, or masses Cardiac: RRR; no murmurs, rubs, or gallops,no edema  Respiratory:  clear to auscultation bilaterally, normal work of breathing GI: soft, nontender, nondistended, + BS MS: no deformity or atrophy  Skin: warm and dry,  device pocket is well healed Neuro:  Strength and sensation are intact Psych: euthymic mood, full affect  EKG:  EKG is ordered today. Personal review of the ekg ordered shows SR, rate 86  Device interrogation is reviewed today in detail.  See PaceArt for details.   Recent Labs: No results found for requested labs within last 8760 hours.    Lipid Panel  No results found for: CHOL, TRIG, HDL, CHOLHDL, VLDL, LDLCALC, LDLDIRECT   Wt Readings from Last 3 Encounters:  12/08/16 219 lb 6.4 oz (99.5 kg)  10/26/16 220 lb 6.4 oz (100 kg)  04/16/14 244 lb 9.6 oz (110.9 kg)      Other studies Reviewed: Additional studies/ records that were reviewed today include: Cardiac cath 07/28/15 Review of the above records today demonstrates:  LMCA: Normal appearance with 0% stenosis. LAD: Normal appearance with 0% stenosis. LCx: Lesion on Mid CX: 20% stenosis 14 mm length . RCA: Normal appearance with 0% stenosis.   ASSESSMENT AND PLAN:  1.  Chronic systolic heart failure due to ischemic cardiomyopathy: Currently on optimal medical therapy status post Biotronik ICD.He is set him up to be followed in our ICD clinic. He does remote monitoring from home.  2. PVCs: Currently asymptomatic. No medication changes at this time.  3. Hypertension: Blood pressure well controlled. Continue Coreg, lisinopril.  4. Hyperlipidemia: Currently on pravastatin. No changes at this time.    Current medicines are reviewed at length with the patient today.   The patient does not have concerns regarding his medicines.  The  following changes were made today:  none  Labs/ tests ordered today include:  Orders Placed This Encounter  Procedures  . CUP PACEART INCLINIC DEVICE CHECK  . EKG 12-Lead     Disposition:   FU with Ulys Favia 1 year  Signed, Alaija Ruble Jorja Loa, MD  12/09/2016 8:23 AM     [facility] [address] [place] [address] [phone] (office) [phone] (fax)

## 2017-01-26 ENCOUNTER — Ambulatory Visit: Payer: Medicare HMO | Admitting: Cardiology

## 2017-03-09 ENCOUNTER — Ambulatory Visit (INDEPENDENT_AMBULATORY_CARE_PROVIDER_SITE_OTHER): Payer: Medicare HMO | Admitting: *Deleted

## 2017-03-09 DIAGNOSIS — I4901 Ventricular fibrillation: Secondary | ICD-10-CM | POA: Diagnosis not present

## 2017-03-09 NOTE — Progress Notes (Signed)
Remote ICD transmission.   

## 2017-03-10 ENCOUNTER — Encounter: Payer: Self-pay | Admitting: Cardiology

## 2017-03-10 NOTE — Progress Notes (Signed)
Letter  

## 2017-03-23 LAB — CUP PACEART REMOTE DEVICE CHECK
Date Time Interrogation Session: 20190102113558
Implantable Lead Location: 753860
Implantable Lead Model: 350
Implantable Lead Serial Number: 29736564
Implantable Lead Serial Number: 49114934
MDC IDC LEAD IMPLANT DT: 20160201
MDC IDC LEAD IMPLANT DT: 20160201
MDC IDC LEAD LOCATION: 753859
MDC IDC PG IMPLANT DT: 20160201
Pulse Gen Serial Number: 60838040

## 2017-04-26 ENCOUNTER — Telehealth: Payer: Self-pay | Admitting: *Deleted

## 2017-04-26 NOTE — Telephone Encounter (Signed)
Mr. Austin Bush had a VT episode with [facility] (successful) on 04/21/17 at 6am. He reports he had no symptoms at the time and he is typically up and moving about at that time of day. He has not missed any doses of Coreg 6.25mg  twice daily. I made him aware of driving restrictions x 6 months per [place]. I will review with Dr. Elberta Fortisamnitz when he is back in the office and call Mr. Borawski back with any recommendations.

## 2017-05-03 NOTE — Telephone Encounter (Signed)
Reviewed episode with Dr. Elberta Fortisamnitz. He advises follow up in office.   Ms. Mikki Harboroplin (DPR) reports that Mr. [place] is out of [place] in [place] for work and is unsure of when he will return. She was given our direct number for Mr. Wambolt to arrange follow up with Dr. Elberta Fortisamnitz upon his return.

## 2017-05-13 ENCOUNTER — Other Ambulatory Visit: Payer: Self-pay | Admitting: Internal Medicine

## 2017-06-08 ENCOUNTER — Encounter: Payer: Medicare HMO | Admitting: *Deleted

## 2017-06-08 ENCOUNTER — Telehealth: Payer: Self-pay | Admitting: Cardiology

## 2017-06-08 NOTE — Telephone Encounter (Signed)
Confirmed remote transmission w/ pt wife.   

## 2017-06-09 ENCOUNTER — Encounter: Payer: Self-pay | Admitting: Cardiology

## 2017-06-14 ENCOUNTER — Telehealth: Payer: Self-pay | Admitting: *Deleted

## 2017-06-14 NOTE — Telephone Encounter (Signed)
Spoke with patient who states that he attempted to send a manual transmission. Per paceart notes patient has been released as he has moved out of state. Patient states that he has moved back and would like to continue following with Dr. Elberta Fortisamnitz. I explained that I will re-enroll him in the DC and will call back if we need any additional action on his part. Patient verbalizes understanding  Attempted to enroll patient in Biotronik home monitoring - unsuccessful. Industry contacted - awaiting feedback on course of action.

## 2017-06-14 NOTE — Progress Notes (Signed)
Cardiology Office Note:    Date:  06/15/2017   ID:  Austin Bush, DOB [birth year], MRN [id number]  PCP:  Gordan Payment., MD  Cardiologist:  Norman Herrlich, MD    Referring MD: Gordan Payment., MD    ASSESSMENT:    1. VT (ventricular tachycardia) (HCC)   2. AICD present, double chamber   3. Coronary artery disease involving native coronary artery of native heart with angina pectoris (HCC)   4. Ischemic cardiomyopathy   5. Essential hypertension   6. Chronic combined systolic and diastolic heart failure (HCC)    PLAN:    In order of problems listed above:  1. Stable the episode was isolated did not requiring VF therapy at this time would not initiate an antiarrhythmic drug.  He will continue his beta-blocker and guideline directed merit medical therapy for CAD and cardiomyopathy.  If recent EF is less than 40 we will transition from ACE to ARNI. 2. Stable continue beta-blocker I would not initiate antiarrhythmic drug we follow his device in our practice 3. Stable continue current medical treatment having no angina await the results of perfusion study and if he had significant ischemia favor coronary angiography. 4. Stable his ejection fraction has markedly improved await results may require optimization of therapy transitioning from ACE to ARNI. 5. Stable continue current guideline directed medical therapy 6. See discussion under cardiomyopathy stable compensated continue his current diuretic beta-blocker ACE inhibitor.   Next appointment: July 2019   Medication Adjustments/Labs and Tests Ordered: Current medicines are reviewed at length with the patient today.  Concerns regarding medicines are outlined above.  No orders of the defined types were placed in this encounter.  No orders of the defined types were placed in this encounter.   Chief Complaint  Patient presents with  . Follow-up    VT  . Congestive Heart Failure  . Coronary Artery Disease  . Cardiomyopathy  .  Hypertension    History of Present Illness:    Austin Bush is a 62 y.o. male with a hx of SCD/VF, ICD, PVC's, CAD, CHF with most recent EF 45%, Dyslipidemia, HTN, S/P PCI last seen 10/26/16. Compliance with diet, lifestyle and medications: Yes He continues to improve from his out of hospital cardiac arrest and CNS injury.  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  January had a short episode of nonsustained VT but did not receive ICD therapy.  He had follow-up at the [facility] hospital in [facility] stress test performed results unknown and laboratory test.  His dose of carvedilol was increased.  He has had no alert since then no chest pain palpitations syncope TIA.  Unfortunately both he and his wife are very distressed in view of his previous sudden cardiac death and he has multiple shocks when he had a lead malfunction.  They seek reassurance.  He is compliant with his medications. Past Medical History:  Diagnosis Date  . AICD present, double chamber 04/23/2015  . Anemia of unknown etiology 09/17/2016  . BPH (benign prostatic hyperplasia) 04/23/2015  . CAD (coronary artery disease) 04/16/2014   PCI to the LAD   . Cardiac arrhythmia 04/23/2015  . Cardiomyopathy (HCC) 12/12/2014  . Cardiomyopathy, ischemic 04/16/2014   EF 25%   . Cervical disc disease 04/23/2015  . Chronic systolic CHF (congestive heart failure), NYHA class 2 (HCC) 09/09/2016  . COPD (chronic obstructive pulmonary disease) (HCC) 04/23/2015  . Coronary artery disease involving native coronary artery of native heart with angina pectoris (HCC)  04/16/2014   PCI to the LAD  Overview:  a. Cardiac cath 02/03/2012 Severe progression of CAD, albeit with patent stents in the proximal mid LAD and mid to distal circumflex with prior intervention in 2009. Now featuring high-grade ulcerative stenosis at the junction of the proximal and mid RCA, high-grade stenosis at the origin of posterior descending branch involving the very distal portion of the  RCA proximal to the bifurcation. 90% stenosis in the mid circumflex above the previously placed stent and 98% stenosis and a large very high takeoff diagonal branch of LAD coronary artery. Very distal LAD apical disease. b. reported catheterization 06/2015 without disease requiring intervention  Overview:  inferoposterior MI June 2009; 12 Sep 2009 with out of hospital SCD, [facility]  Cardiac cath 07/28/15: Conclusions Diagnostic Procedure Summary Mild stenosis of the Circumflex artery Otherwise no obstructive coronary artery disease. Segmental LV dysfunction of inferior wall -moderate hypokinesis   . Diabetes, polyneuropathy (HCC) 04/24/2015  . 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 Dyslipidemia 04/16/2014  . Encounter for long-term (current) use of high-risk medication 04/23/2015  . Encounter for screening for malignant neoplasm 04/23/2015  . Essential hypertension 04/16/2014  . Fatty liver 04/23/2015  . Gastro-esophageal reflux 04/23/2015  . High risk medication use 09/15/2016  . History of sudden cardiac arrest 04/16/2014   08/2007   . Implanted defibrillator electrode lead fracture 04/23/2015  . Insomnia 04/23/2015  . Ischemic cardiomyopathy 04/16/2014   EF 25%  Overview:  a. EF of 30% after myocardial infarction and cardiac arrest in 2009.  b. S/P dual-chamber ICD in 2009.  c. S/P ICD replacement and ICD generator change in 2012 due to lead malfunction resulting and 32 ICD shocks.  d. Echo 8/13.2013 TDS. LV chamber size is normal. Global LV wall motion and contractility are within normal limits. Estimated EF 55-60%. RV is mildly dilated. RV global systolic function is mildly reduced. Trace MR. Trace TR.  e. S/P system explant from the left with reimplantation on the right side February 1st 2016.  f. Echo 10/31/2014 LA 4.42cm. EF 25-30%. LV chamber size is mildly dilated. E:A reversal in the  MV flow pattern suggestive of diastolic dysfunction. LA is mild to moderately dilated. RV is mildly dilated. RV global systolic function is mildly reduced. Mild AR, MR, TR, trace PR. Trivial pericardial effusion is visualized IVC is dilated.  . Lung nodule 04/23/2015  . Mixed hyperlipidemia 02/27/2016   Overview:  a. Statin therapy.   . Nocturnal leg cramps 09/15/2016  . Obesity (BMI 30.0-34.9) 04/16/2014  . Obstructive sleep apnea syndrome 04/16/2014  . Old MI (myocardial infarction) 09/09/2016  . OSA on CPAP 04/16/2014  . Osteoarthritis 04/23/2015  . PVC (premature ventricular contraction) 02/27/2016  . S/P implantation of automatic cardioverter/defibrillator (AICD) 04/16/2014   Biotronik - history of inappropriate shocks, at least 2 lead replacements   . Vitamin D deficiency 04/23/2015    Past Surgical History:  Procedure Laterality Date  . CARDIAC CATHETERIZATION    . INSERT / REPLACE / REMOVE PACEMAKER     Biotronik AICD  . KNEE SURGERY    . UMBILICAL HERNIA REPAIR      Current Medications: Current Meds  Medication Sig  . aspirin EC 81 MG tablet Take 162 mg by mouth daily.   . carvedilol (COREG) 12.5 MG tablet Take 12.5 mg by mouth as directed. Takes 1 tablet in am, and 0.5  tablet in the pm  . Cholecalciferol (D 1000) 1000 units capsule Take 1,000 Units by mouth daily.   . furosemide (LASIX) 40 MG tablet Take 1 tablet (40 mg total) by mouth 2 (two) times daily. Take an extra tablet in the morning if you weigh 206 or greater  . gabapentin (NEURONTIN) 400 MG capsule Take 600 mg by mouth 3 (three) times daily.   Marland Kitchen KLOR-CON M20 20 MEQ tablet TAKE 2 TABLETS EVERY DAY  . lisinopril (PRINIVIL,ZESTRIL) 5 MG tablet Take 5 mg by mouth 2 (two) times daily.  . meloxicam (MOBIC) 15 MG tablet Take 1 tablet (15 mg total) by mouth daily.  . metFORMIN (GLUCOPHAGE) 500 MG tablet Take 500 mg by mouth 2 (two) times daily with a meal.   . methocarbamol (ROBAXIN) 500 MG tablet Take 1,000 mg by mouth 2 (two)  times daily.  . Multiple Vitamin (MULTIVITAMIN) tablet Take 1 tablet by mouth daily.  . nitroGLYCERIN (NITROSTAT) 0.4 MG SL tablet Place 0.4 mg under the tongue every 5 (five) minutes as needed for chest pain.   . Omega-3 Fatty Acids (FISH OIL) 1200 MG CPDR Take 1,200 mg by mouth 3 (three) times daily.  Marland Kitchen omeprazole (PRILOSEC) 20 MG capsule Take 20 mg by mouth 2 (two) times daily.  . pravastatin (PRAVACHOL) 40 MG tablet TAKE 1 TABLET EVERY DAY     Allergies:   Patient has no known allergies.   Social History   Socioeconomic History  . Marital status: Married    Spouse name: Not on file  . Number of children: Not on file  . Years of education: Not on file  . Highest education level: Not on file  Occupational History  . Not on file  Social Needs  . Financial resource strain: Not on file  . Food insecurity:    Worry: Not on file    Inability: Not on file  . Transportation needs:    Medical: Not on file    Non-medical: Not on file  Tobacco Use  . Smoking status: Former Smoker    Years: 30.00  . Smokeless tobacco: Never Used  Substance and Sexual Activity  . Alcohol use: Yes    Alcohol/week: 0.6 oz    Types: 1 Glasses of wine per week  . Drug use: No  . Sexual activity: Not on file  Lifestyle  . Physical activity:    Days per week: Not on file    Minutes per session: Not on file  . Stress: Not on file  Relationships  . Social connections:    Talks on phone: Not on file    Gets together: Not on file    Attends religious service: Not on file    Active member of club or organization: Not on file    Attends meetings of clubs or organizations: Not on file    Relationship status: Not on file  Other Topics Concern  . Not on file  Social History Narrative  . Not on file     Family History: The patient's family history includes Heart attack in his maternal grandfather; Hypertension in his father; Lung cancer in his father. ROS:   Please see the history of present  illness.    All other systems reviewed and are negative.  EKGs/Labs/Other Studies Reviewed:    The following studies were reviewed today: [facility] from [facility] request including myocardial perfusion study EKG labs  Recent Labs:   11/24/16: CMP normal No results found for requested  labs within last 8760 hours.  Recent Lipid Panel   09/15/16: Chol 150 HDL 64 LDL 76 No results found for: CHOL, TRIG, HDL, CHOLHDL, VLDL, LDLCALC, LDLDIRECT  Physical Exam:    VS:  BP 100/62 (BP Location: Right Arm, Patient Position: Sitting, Cuff Size: Normal)   Pulse 95   Ht 6' (1.829 m)   Wt 201 lb (91.2 kg)   SpO2 97%   BMI 27.26 kg/m     Wt Readings from Last 3 Encounters:  06/15/17 201 lb (91.2 kg)  12/08/16 219 lb 6.4 oz (99.5 kg)  10/26/16 220 lb 6.4 oz (100 kg)     GEN:  Well nourished, well developed in no acute distress HEENT: Normal NECK: No JVD; No carotid bruits LYMPHATICS: No lymphadenopathy CARDIAC:  RRR, no murmurs, rubs, gallops RESPIRATORY:  Clear to auscultation without rales, wheezing or rhonchi  ABDOMEN: Soft, non-tender, non-distended MUSCULOSKELETAL:  No edema; No deformity  SKIN: Warm and dry NEUROLOGIC:  Alert and oriented x 3 PSYCHIATRIC:  Normal affect    Signed, Norman Herrlich, MD  06/15/2017 5:42 PM    [facility]

## 2017-06-14 NOTE — Telephone Encounter (Signed)
°  1. Has your device fired? no  2. Is you device beeping? no  3. Are you experiencing draining or swelling at device site? No   4. Are you calling to see if we received your device transmission? yes  5. Have you passed out? no    Please route to Device Clinic Pool

## 2017-06-15 ENCOUNTER — Ambulatory Visit: Payer: Medicare HMO | Admitting: Cardiology

## 2017-06-15 ENCOUNTER — Encounter: Payer: Self-pay | Admitting: Cardiology

## 2017-06-15 VITALS — BP 100/62 | HR 95 | Ht 72.0 in | Wt 201.0 lb

## 2017-06-15 DIAGNOSIS — I472 Ventricular tachycardia, unspecified: Secondary | ICD-10-CM

## 2017-06-15 DIAGNOSIS — I255 Ischemic cardiomyopathy: Secondary | ICD-10-CM | POA: Diagnosis not present

## 2017-06-15 DIAGNOSIS — I25119 Atherosclerotic heart disease of native coronary artery with unspecified angina pectoris: Secondary | ICD-10-CM | POA: Diagnosis not present

## 2017-06-15 DIAGNOSIS — I5042 Chronic combined systolic (congestive) and diastolic (congestive) heart failure: Secondary | ICD-10-CM | POA: Diagnosis not present

## 2017-06-15 DIAGNOSIS — Z95810 Presence of automatic (implantable) cardiac defibrillator: Secondary | ICD-10-CM | POA: Diagnosis not present

## 2017-06-15 DIAGNOSIS — I10 Essential (primary) hypertension: Secondary | ICD-10-CM

## 2017-06-15 HISTORY — DX: Ventricular tachycardia: I47.2

## 2017-06-15 HISTORY — DX: Ventricular tachycardia, unspecified: I47.20

## 2017-06-15 NOTE — Patient Instructions (Addendum)
Medication Instructions:  Your physician recommends that you continue on your current medications as directed. Please refer to the Current Medication list given to you today.  Labwork: None  Testing/Procedures: None  Follow-Up: Your physician wants you to follow-up in: 4 months. You will receive a reminder letter in the mail two months in advance. If you don't receive a letter, please call our office to schedule the follow-up appointment.  Any Other Special Instructions Will Be Listed Below (If Applicable).     If you need a refill on your cardiac medications before your next appointment, please call your pharmacy.    1. Avoid all over-the-counter antihistamines except Claritin/Loratadine and Zyrtec/Cetrizine. 2. Avoid all combination including cold sinus allergies flu decongestant and sleep medications 3. You can use Robitussin DM Mucinex and Mucinex DM for cough. 4. can use Tylenol aspirin ibuprofen and naproxen but no combinations such as sleep or sinus.

## 2017-06-16 NOTE — Telephone Encounter (Signed)
Successfully enrolled patient in [employer] home monitoring system. Will continue to monitor remotely.

## 2017-08-11 ENCOUNTER — Telehealth: Payer: Self-pay | Admitting: Cardiology

## 2017-08-11 NOTE — Telephone Encounter (Signed)
Has lost his phone that transmits his pacemaker

## 2017-08-16 NOTE — Telephone Encounter (Signed)
Confirmed that patient has lost his remote transmitter. Advised patient would forward message to device clinic and he would receive a phone call from them for further instruction. Patient verbalized understanding. No further questions.

## 2017-08-16 NOTE — Telephone Encounter (Signed)
Reached out to [employer], [employer] rep, to find out the best way to get a new monitor for the patient.  Will call patient with update once I hear back.

## 2017-08-16 NOTE — Telephone Encounter (Signed)
Spoke with patient, confirmed home address.  Austin Bush will ship new monitor to patient's address.  Patient is aware and appreciative.  He denies additional questions or concerns at this time.

## 2017-08-23 ENCOUNTER — Telehealth: Payer: Self-pay

## 2017-08-23 NOTE — Telephone Encounter (Signed)
Spoke with pt regarding successful  ATP x 1  episode with HR of 197bpm   from 08/23/2047@ 348pm pt reports compliance with Coreg 12.5mg  in am and 6.25mg  in pm. Pt reported he might have gotten dizzy about that time but that he has some intermittent dizzy spells occasionally. Asked pt what his BP normally runs pt stated that yesterday 10-15 min after taking am medications he took his BP and it was 100/60.  Pt voiced understanding of driving restrictions x 6 months, informed pt that this would reviewed with Dr. Elberta Fortisamnitz and would call back with any further recommendations, pt voiced understanding.

## 2017-08-25 NOTE — Progress Notes (Signed)
Cardiology Office Note:    Date:  08/26/2017   ID:  Austin Bush, DOB [birth year], MRN [id number]  PCP:  Gordan Payment., MD  Cardiologist:  Norman Herrlich, MD    Referring MD: Gordan Payment., MD    ASSESSMENT:    1. VT (ventricular tachycardia) (HCC)   2. Chronic combined systolic and diastolic heart failure (HCC)   3. Hyperlipidemia, unspecified hyperlipidemia type   4. 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 He will initiate oral amiodarone loading as directed by EP.  We will recheck renal function today along with magnesium potassium and a BNP level. 2. Stable, New York Heart Association class II his last EF was 26% he will transition from ACE inhibitor to ARNI.  I will see back in the office in 6 weeks regarding arrhythmia and up titration.  He has no fluid overload we will continue his current loop diuretic 3. Stable continue current lipid-lowering therapy 4. Stable following our device clinic, telemetry allowed [place] to capture his recurrent VT prompting amiodarone loading   Next appointment: 6 weeks   Medication Adjustments/Labs and Tests Ordered: Current medicines are reviewed at length with the patient today.  Concerns regarding medicines are outlined above.  Orders Placed This Encounter  Procedures  . Basic metabolic panel  . Magnesium  . Pro b natriuretic peptide (BNP)  . EKG 12-Lead   Meds ordered this encounter  Medications  . sacubitril-valsartan (ENTRESTO) 24-26 MG    Sig: Take 1 tablet by mouth 2 (two) times daily.    Dispense:  60 tablet    Refill:  11  . aspirin EC 81 MG tablet    Sig: Take 1 tablet (81 mg total) by mouth daily.    Chief Complaint  Patient presents with  . Follow-up    History of Present Illness:    Austin Bush is a 62 y.o. male with a hx of SCD/VF, ICD,PVC's,CAD, CHFwith most recent EF 45%, Dyslipidemia, HTN, S/P PCI last seen 06/15/17. He had 2 episodes  of VT terminated by pacing this week and advised to start oral amiodarone load today. A Lexiscan NPI in March 2019 showed scar no ischemia EF 26%.   Austin Lemming, MD  to Austin Daft, RN     Needs amiodarone 400 mg BID for 2 weeks, 200 mg BID for 2 weeks, then 200 mg daily with follow up in clinic.       Austin Daft, RN  to  Austin Lemming, MD      08/26/17 1:45 PM   Pt with 2 separate episodes of VT this week that were terminated with ATP x 1 with each episode, V- rates 195-215bpm.    ASSESSMENT:    06/15/17   1. VT (ventricular tachycardia) (HCC)   2. AICD present, double chamber   3. Coronary artery disease involving native coronary artery of native heart with angina pectoris (HCC)   4. Ischemic cardiomyopathy   5. Essential hypertension   6. Chronic combined systolic and diastolic heart failure (HCC)    PLAN:    In order of problems listed above:  1.   Stable the episode was isolated did not requiring VF therapy at this time would not initiate an antiarrhythmic drug.  He will continue his beta-blocker and guideline directed merit medical therapy for CAD and cardiomyopathy.  If recent EF is less than 40 we will transition from  ACE to ARNI. 5. Stable continue beta-blocker I would not initiate antiarrhythmic drug we follow his device in our practice 6. Stable continue current medical treatment having no angina await the results of perfusion study and if he had significant ischemia favor coronary angiography. 7. Stable his ejection fraction has markedly improved await results may require optimization of therapy transitioning from ACE to ARNI. 8. Stable continue current guideline directed medical therapy 9. See discussion under cardiomyopathy stable compensated continue his current diuretic beta-blocker ACE inhibitor.  Compliance with diet, lifestyle and medications: Yes  Overall he feels well he has had episodes of dizziness unrelated to  arrhythmia with sounds like vertigo.  He also is short of breath when he climbs stairs but he works full-time and is not short of breath otherwise no orthopnea edema chest pain or syncope.  Last EF 26% and to optimize medical treatment of transition from ACE to ARNI.  He will initiate amiodarone loading will check potassium magnesium and BNP level.  EKG shows T inversions similar to previous tracings that I reviewed he has had no recent angina Past Medical History:  Diagnosis Date  . AICD present, double chamber 04/23/2015  . Anemia of unknown etiology 09/17/2016  . BPH (benign prostatic hyperplasia) 04/23/2015  . CAD (coronary artery disease) 04/16/2014   PCI to the LAD   . Cardiac arrhythmia 04/23/2015  . Cardiomyopathy (HCC) 12/12/2014  . Cardiomyopathy, ischemic 04/16/2014   EF 25%   . Cervical disc disease 04/23/2015  . Chronic systolic CHF (congestive heart failure), NYHA class 2 (HCC) 09/09/2016  . COPD (chronic obstructive pulmonary disease) (HCC) 04/23/2015  . Coronary artery disease involving native coronary artery of native heart with angina pectoris (HCC) 04/16/2014   PCI to the LAD  Overview:  a. Cardiac cath 02/03/2012 Severe progression of CAD, albeit with patent stents in the proximal mid LAD and mid to distal circumflex with prior intervention in 2009. Now featuring high-grade ulcerative stenosis at the junction of the proximal and mid RCA, high-grade stenosis at the origin of posterior descending branch involving the very distal portion of the RCA proximal to the bifurcation. 90% stenosis in the mid circumflex above the previously placed stent and 98% stenosis and a large very high takeoff diagonal branch of LAD coronary artery. Very distal LAD apical disease. b. reported catheterization 06/2015 without disease requiring intervention  Overview:  inferoposterior MI June 2009; 12 Sep 2009 with out of hospital SCD, [facility]  Cardiac cath 07/28/15: Conclusions Diagnostic Procedure  Summary Mild stenosis of the Circumflex artery Otherwise no obstructive coronary artery disease. Segmental LV dysfunction of inferior wall -moderate hypokinesis   . Diabetes, polyneuropathy (HCC) 04/24/2015  . 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 Dyslipidemia 04/16/2014  . Encounter for long-term (current) use of high-risk medication 04/23/2015  . Encounter for screening for malignant neoplasm 04/23/2015  . Essential hypertension 04/16/2014  . Fatty liver 04/23/2015  . Gastro-esophageal reflux 04/23/2015  . High risk medication use 09/15/2016  . History of sudden cardiac arrest 04/16/2014   08/2007   . Implanted defibrillator electrode lead fracture 04/23/2015  . Insomnia 04/23/2015  . Ischemic cardiomyopathy 04/16/2014   EF 25%  Overview:  a. EF of 30% after myocardial infarction and cardiac arrest in 2009.  b. S/P dual-chamber ICD in 2009.  c. S/P ICD replacement and ICD generator change in 2012 due to lead malfunction resulting and  32 ICD shocks.  d. Echo 8/13.2013 TDS. LV chamber size is normal. Global LV wall motion and contractility are within normal limits. Estimated EF 55-60%. RV is mildly dilated. RV global systolic function is mildly reduced. Trace MR. Trace TR.  e. S/P system explant from the left with reimplantation on the right side February 1st 2016.  f. Echo 10/31/2014 LA 4.42cm. EF 25-30%. LV chamber size is mildly dilated. E:A reversal in the MV flow pattern suggestive of diastolic dysfunction. LA is mild to moderately dilated. RV is mildly dilated. RV global systolic function is mildly reduced. Mild AR, MR, TR, trace PR. Trivial pericardial effusion is visualized IVC is dilated.  . Lung nodule 04/23/2015  . Mixed hyperlipidemia 02/27/2016   Overview:  a. Statin therapy.   . Nocturnal leg cramps 09/15/2016  . Obesity (BMI 30.0-34.9) 04/16/2014  . Obstructive sleep apnea syndrome 04/16/2014  .  Old MI (myocardial infarction) 09/09/2016  . OSA on CPAP 04/16/2014  . Osteoarthritis 04/23/2015  . PVC (premature ventricular contraction) 02/27/2016  . S/P implantation of automatic cardioverter/defibrillator (AICD) 04/16/2014   Biotronik - history of inappropriate shocks, at least 2 lead replacements   . Vitamin D deficiency 04/23/2015    Past Surgical History:  Procedure Laterality Date  . CARDIAC CATHETERIZATION    . INSERT / REPLACE / REMOVE PACEMAKER     Biotronik AICD  . KNEE SURGERY    . UMBILICAL HERNIA REPAIR      Current Medications: Current Meds  Medication Sig  . amiodarone (PACERONE) 200 MG tablet Take 2 tablets (400 mg total) by mouth 2 (two) times daily for 14 days, THEN 1 tablet (200 mg total) 2 (two) times daily for 14 days, THEN 1 tablet (200 mg total) daily.  Marland Kitchen aspirin EC 81 MG tablet Take 1 tablet (81 mg total) by mouth daily.  . carvedilol (COREG) 12.5 MG tablet Take 12.5 mg by mouth as directed. Takes 1 tablet in am, and 0.5 tablet in the pm  . Cholecalciferol (D 1000) 1000 units capsule Take 1,000 Units by mouth daily.   . furosemide (LASIX) 40 MG tablet Take 1 tablet (40 mg total) by mouth 2 (two) times daily. Take an extra tablet in the morning if you weigh 206 or greater  . gabapentin (NEURONTIN) 400 MG capsule Take 600 mg by mouth 3 (three) times daily.   Marland Kitchen KLOR-CON M20 20 MEQ tablet TAKE 2 TABLETS EVERY DAY  . meloxicam (MOBIC) 15 MG tablet Take 1 tablet (15 mg total) by mouth daily.  . metFORMIN (GLUCOPHAGE) 500 MG tablet Take 500 mg by mouth 2 (two) times daily with a meal.   . methocarbamol (ROBAXIN) 500 MG tablet Take 1,000 mg by mouth 2 (two) times daily.  . Multiple Vitamin (MULTIVITAMIN) tablet Take 1 tablet by mouth daily.  . nitroGLYCERIN (NITROSTAT) 0.4 MG SL tablet Place 0.4 mg under the tongue every 5 (five) minutes as needed for chest pain.   . Omega-3 Fatty Acids (FISH OIL) 1200 MG CPDR Take 1,200 mg by mouth 3 (three) times daily.  Marland Kitchen omeprazole  (PRILOSEC) 20 MG capsule Take 20 mg by mouth 2 (two) times daily.  . pravastatin (PRAVACHOL) 40 MG tablet TAKE 1 TABLET EVERY DAY  . [DISCONTINUED] aspirin EC 81 MG tablet Take 162 mg by mouth daily.   . [DISCONTINUED] lisinopril (PRINIVIL,ZESTRIL) 5 MG tablet Take 5 mg by mouth 2 (two) times daily.     Allergies:   Patient has no known allergies.   Social  History   Socioeconomic History  . Marital status: Married    Spouse name: Not on file  . Number of children: Not on file  . Years of education: Not on file  . Highest education level: Not on file  Occupational History  . Not on file  Social Needs  . Financial resource strain: Not on file  . Food insecurity:    Worry: Not on file    Inability: Not on file  . Transportation needs:    Medical: Not on file    Non-medical: Not on file  Tobacco Use  . Smoking status: Former Smoker    Years: 30.00  . Smokeless tobacco: Never Used  Substance and Sexual Activity  . Alcohol use: Yes    Alcohol/week: 0.6 oz    Types: 1 Glasses of wine per week  . Drug use: No  . Sexual activity: Not on file  Lifestyle  . Physical activity:    Days per week: Not on file    Minutes per session: Not on file  . Stress: Not on file  Relationships  . Social connections:    Talks on phone: Not on file    Gets together: Not on file    Attends religious service: Not on file    Active member of club or organization: Not on file    Attends meetings of clubs or organizations: Not on file    Relationship status: Not on file  Other Topics Concern  . Not on file  Social History Narrative  . Not on file     Family History: The patient's family history includes Heart attack in his maternal grandfather; Hypertension in his father; Lung cancer in his father. ROS:   Please see the history of present illness.    All other systems reviewed and are negative.  EKGs/Labs/Other Studies Reviewed:    The following studies were reviewed today:  EKG:  EKG  ordered today.  The ekg ordered today demonstrates [facility] old inferior MI T inversion  Recent Labs: No results found for requested labs within last 8760 hours.  Recent Lipid Panel No results found for: CHOL, TRIG, HDL, CHOLHDL, VLDL, LDLCALC, LDLDIRECT  Physical Exam:    VS:  BP 108/60 (BP Location: Right Arm, Patient Position: Sitting, Cuff Size: Normal)   Pulse 76   Ht 6' (1.829 m)   Wt 218 lb (98.9 kg)   BMI 29.57 kg/m     Wt Readings from Last 3 Encounters:  08/26/17 218 lb (98.9 kg)  06/15/17 201 lb (91.2 kg)  12/08/16 219 lb 6.4 oz (99.5 kg)     GEN:  Well nourished, well developed in no acute distress HEENT: Normal NECK: No JVD; No carotid bruits LYMPHATICS: No lymphadenopathy CARDIAC: RRR, no murmurs, rubs, gallops RESPIRATORY:  Clear to auscultation without rales, wheezing or rhonchi  ABDOMEN: Soft, non-tender, non-distended MUSCULOSKELETAL:  No edema; No deformity  SKIN: Warm and dry NEUROLOGIC:  Alert and oriented x 3 PSYCHIATRIC:  Normal affect    Signed, Norman Herrlich, MD  08/26/2017 4:22 PM    [facility]

## 2017-08-26 ENCOUNTER — Telehealth: Payer: Self-pay

## 2017-08-26 ENCOUNTER — Ambulatory Visit: Payer: Medicare HMO | Admitting: Cardiology

## 2017-08-26 ENCOUNTER — Encounter: Payer: Self-pay | Admitting: Cardiology

## 2017-08-26 VITALS — BP 108/60 | HR 76 | Ht 72.0 in | Wt 218.0 lb

## 2017-08-26 DIAGNOSIS — E785 Hyperlipidemia, unspecified: Secondary | ICD-10-CM | POA: Diagnosis not present

## 2017-08-26 DIAGNOSIS — I5042 Chronic combined systolic (congestive) and diastolic (congestive) heart failure: Secondary | ICD-10-CM

## 2017-08-26 DIAGNOSIS — Z95810 Presence of automatic (implantable) cardiac defibrillator: Secondary | ICD-10-CM

## 2017-08-26 DIAGNOSIS — I472 Ventricular tachycardia, unspecified: Secondary | ICD-10-CM

## 2017-08-26 MED ORDER — ASPIRIN EC 81 MG PO TBEC: 81.0000 mg | DELAYED_RELEASE_TABLET | Freq: Every day | ORAL | Status: AC

## 2017-08-26 MED ORDER — AMIODARONE HCL 200 MG PO TABS: ORAL_TABLET | ORAL | 1 refills | Status: DC

## 2017-08-26 MED ORDER — SACUBITRIL-VALSARTAN 24-26 MG PO TABS: 1.0000 | ORAL_TABLET | Freq: Two times a day (BID) | ORAL | 11 refills | Status: DC

## 2017-08-26 NOTE — Telephone Encounter (Signed)
FYI

## 2017-08-26 NOTE — Patient Instructions (Signed)
Medication Instructions:  Your physician has recommended you make the following change in your medication:  STOP lisinopril  REDUCE aspirin to 81 mg daily START sacubitril-valsartan (Entresto) 24-26 mg twice daily. Do NOT begin until you have been off lisinopril for 3 full days.  Labwork: Your physician recommends that you have the following labs drawn: BMP, CBC, magnesium  Testing/Procedures: You had an EKG today.  Follow-Up: Your physician recommends that you schedule a follow-up appointment in: 6 weeks in [place].  Any Other Special Instructions Will Be Listed Below (If Applicable).     If you need a refill on your cardiac medications before your next appointment, please call your pharmacy.

## 2017-08-26 NOTE — Telephone Encounter (Signed)
Spoke with pt regarding Dr.Camnitz recommendations informed pt of starting Amiodarone and the tapering dosage pt wrote down the instructions for taking the medication, informed pt that I would send this prescription into [facility]

## 2017-08-26 NOTE — Telephone Encounter (Signed)
Needs amiodarone 400 mg BID for 2 weeks, 200 mg BID for 2 weeks, then 200 mg daily with follow up in clinic.

## 2017-08-26 NOTE — Telephone Encounter (Signed)
Spoke with pt regarding VT episode from 08/25/17@5 :25am rates 195-205bpm  that was terminated with ATP x 1, pt stated he was asleep and doesn't remember anything pt stated that he had been compliant with his medications. Informed pt that I would route this message to Dr. Dulce SellarMunley and Dr. Estill Doomsamntiz for recommendations since this is the second episode this week of VT terminated with ATP x 1, and pt is getting concerned, pt voiced understanding of driving restrictions x 6 months.

## 2017-08-27 LAB — BASIC METABOLIC PANEL
BUN/Creatinine Ratio: 17 (ref 10–24)
BUN: 15 mg/dL (ref 8–27)
CALCIUM: 9.3 mg/dL (ref 8.6–10.2)
CO2: 26 mmol/L (ref 20–29)
CREATININE: 0.88 mg/dL (ref 0.76–1.27)
Chloride: 97 mmol/L (ref 96–106)
GFR calc Af Amer: 107 mL/min/{1.73_m2} (ref >59)
GFR calc non Af Amer: 93 mL/min/{1.73_m2} (ref >59)
Glucose: 132 mg/dL — ABNORMAL HIGH (ref 65–99)
Potassium: 3.9 mmol/L (ref 3.5–5.2)
Sodium: 140 mmol/L (ref 134–144)

## 2017-08-27 LAB — PRO B NATRIURETIC PEPTIDE: NT-PRO BNP: 88 pg/mL (ref 0–210)

## 2017-08-27 LAB — MAGNESIUM: Magnesium: 2.1 mg/dL (ref 1.6–2.3)

## 2017-08-29 ENCOUNTER — Telehealth: Payer: Self-pay

## 2017-08-29 ENCOUNTER — Other Ambulatory Visit: Payer: Self-pay

## 2017-08-29 MED ORDER — AMIODARONE HCL 200 MG PO TABS: ORAL_TABLET | ORAL | 1 refills | Status: DC

## 2017-08-29 MED ORDER — SACUBITRIL-VALSARTAN 24-26 MG PO TABS: 1.0000 | ORAL_TABLET | Freq: Two times a day (BID) | ORAL | 11 refills | Status: DC

## 2017-08-29 MED ORDER — AMIODARONE HCL 200 MG PO TABS: 200.0000 mg | ORAL_TABLET | Freq: Every day | ORAL | 3 refills | Status: DC

## 2017-08-29 NOTE — Telephone Encounter (Signed)
I called patient with normal lab results per Dr Dulce SellarMunley.  Patient states that Entresto 24-26mg  one tablet BID needs to be sent to [facility] [place].  Rx was sent as requested by patient.

## 2017-08-29 NOTE — Telephone Encounter (Signed)
Patient wife called about a prescription that was [facility]. Lanora Manislizabeth spoke with her and [facility].

## 2017-08-30 ENCOUNTER — Telehealth: Payer: Self-pay | Admitting: Cardiovascular Disease

## 2017-08-30 NOTE — Telephone Encounter (Signed)
Spoke with Azalia BilisElizabeth Watts, RN. She may have compared that you load amiodarone like you may taper prednisone but that Mr. Braxton is not to start or take prednisone. Mrs. Hamre aware and appreciative of call back.

## 2017-08-30 NOTE — Telephone Encounter (Signed)
Ms. Mikki Harboroplin says that Mr. Quinteros wrote down "prednisone" while he was talking to the nurse yesterday but doesn't recall why he wrote it down and he's anxious about it. I will speak with Baxter HireKristen who dealt with Mr. Mikki Harboroplin yesterday and call Mrs. Pearlman back.  She reports that due to a history of being shocked multiple times with an old device Mr. Curtner has anxiety about anything to do with his ICD. Remote monitoring reviewed- normal device function, 82% on battery.

## 2017-08-30 NOTE — Telephone Encounter (Signed)
New Message     1. Has your device fired? no  2. Is you device beeping? no  3. Are you experiencing draining or swelling at device site? no  4. Are you calling to see if we received your device transmission? no  5. Have you passed out? No  Patients wife is calling in reference to the medication the patient takes. She does not know the name of the medication but insist on speaking only with the device clinic. Please call     Please route to Device Clinic Pool

## 2017-09-07 ENCOUNTER — Telehealth: Payer: Self-pay | Admitting: Cardiology

## 2017-09-07 NOTE — Telephone Encounter (Signed)
Patient was given samples of Entresto at office visit appointment on 08/26/17. [facility] [place]. Advised that we typically wait for the next follow-up with Dr. Dulce SellarMunley, which for this patient is on 10/07/17, before we send in the prescription to a mail order pharmacy because Dr. Dulce SellarMunley often increases the dose if the patient tolerates the medication well and lab work does not show any abnormalities. Diane verbalized understanding and just wanted to make sure that the medication was sent to a pharmacy. She said that they are fine to wait [facility] until next follow-up with Dr. Dulce SellarMunley to determine if dose will change. No further questions.

## 2017-09-07 NOTE — Telephone Encounter (Signed)
Wants to know if [facility]

## 2017-09-09 ENCOUNTER — Telehealth: Payer: Self-pay

## 2017-09-09 NOTE — Telephone Encounter (Signed)
**Note De-identified Detrick Dani Obfuscation** -----  **Note De-Identified Durwin Davisson Obfuscation** Message from Danton ClapMelissa A Tatum sent at 09/07/2017  9:57 AM EDT ----- Pt's wife checking on status of pre-auth for Amiodarone, pls call Diane @ [phone].   Melissa

## 2017-09-09 NOTE — Telephone Encounter (Signed)
**Note De-Identified Kieley Akter Obfuscation** I was unaware that a PA was needed for the pts Amiodarone.  I did an Amiodarone PA through covermymeds this morning and received a message that this medication is available without authorization.  I attempted to reach the pts wife at the phone number provided but got no answer and no way to leave a message. I then called the pts cell phone and got no answer so I left a message on his VM asking him to call me back.  I called [facility], the pts pharmacy, and was advised that the pt needs an [place] PA not an Amiodarone. I was also advised that Sherryll Burgerntresto was prescribed by Dr Dulce SellarMunley in our [place] office and that they have notified that office that [facility].

## 2017-09-09 NOTE — Telephone Encounter (Signed)
The pts wife called me back and I advised her that Amiodarone does not need a PA but that the pts Entresto does. She is aware that per her pharmacy Dr Thersa SaltMunleys office has been notified that an [place] PA is needed.

## 2017-09-13 ENCOUNTER — Ambulatory Visit (INDEPENDENT_AMBULATORY_CARE_PROVIDER_SITE_OTHER): Payer: Medicare HMO | Admitting: *Deleted

## 2017-09-13 DIAGNOSIS — I472 Ventricular tachycardia, unspecified: Secondary | ICD-10-CM

## 2017-09-13 NOTE — Progress Notes (Signed)
Remote ICD transmission.   

## 2017-09-14 ENCOUNTER — Ambulatory Visit (INDEPENDENT_AMBULATORY_CARE_PROVIDER_SITE_OTHER): Payer: Medicare HMO | Admitting: Cardiology

## 2017-09-14 ENCOUNTER — Encounter: Payer: Self-pay | Admitting: Cardiology

## 2017-09-14 ENCOUNTER — Other Ambulatory Visit: Payer: Self-pay | Admitting: Cardiology

## 2017-09-14 VITALS — BP 133/76 | HR 89 | Ht 72.0 in | Wt 216.8 lb

## 2017-09-14 DIAGNOSIS — I472 Ventricular tachycardia, unspecified: Secondary | ICD-10-CM

## 2017-09-14 DIAGNOSIS — I255 Ischemic cardiomyopathy: Secondary | ICD-10-CM | POA: Diagnosis not present

## 2017-09-14 DIAGNOSIS — I25810 Atherosclerosis of coronary artery bypass graft(s) without angina pectoris: Secondary | ICD-10-CM | POA: Diagnosis not present

## 2017-09-14 DIAGNOSIS — I10 Essential (primary) hypertension: Secondary | ICD-10-CM

## 2017-09-14 DIAGNOSIS — E785 Hyperlipidemia, unspecified: Secondary | ICD-10-CM | POA: Diagnosis not present

## 2017-09-14 LAB — CUP PACEART REMOTE DEVICE CHECK
Battery Remaining Percentage: 81 %
Battery Voltage: 3.12 V
Brady Statistic AS VS Percent: 100 %
Brady Statistic RA Percent Paced: 0 %
Brady Statistic RV Percent Paced: 0 %
HighPow Impedance: 93 Ohm
Implantable Lead Implant Date: 20160201
Implantable Lead Location: 753859
Implantable Lead Model: 350
Implantable Lead Model: 379
Implantable Pulse Generator Implant Date: 20160201
Lead Channel Impedance Value: 546 Ohm
Lead Channel Pacing Threshold Pulse Width: 0.5 ms
Lead Channel Setting Pacing Amplitude: 2 V
Lead Channel Setting Pacing Amplitude: 2 V
Lead Channel Setting Pacing Pulse Width: 0.5 ms
Lead Channel Setting Sensing Sensitivity: 0.8 mV
MDC IDC LEAD IMPLANT DT: 20160201
MDC IDC LEAD LOCATION: 753860
MDC IDC LEAD SERIAL: 29736564
MDC IDC LEAD SERIAL: 49114934
MDC IDC MSMT LEADCHNL RA IMPEDANCE VALUE: 593 Ohm
MDC IDC MSMT LEADCHNL RA PACING THRESHOLD AMPLITUDE: 0.5 V
MDC IDC PG SERIAL: 60838040
MDC IDC SESS DTM: 20190626060755
MDC IDC STAT BRADY AP VP PERCENT: 0 %
MDC IDC STAT BRADY AP VS PERCENT: 0 %
MDC IDC STAT BRADY AS VP PERCENT: 0 %

## 2017-09-14 MED ORDER — CARVEDILOL 12.5 MG PO TABS: 12.5000 mg | ORAL_TABLET | Freq: Two times a day (BID) | ORAL | 3 refills | Status: DC

## 2017-09-14 MED ORDER — NITROGLYCERIN 0.4 MG SL SUBL: 0.4000 mg | SUBLINGUAL_TABLET | SUBLINGUAL | 2 refills | Status: DC | PRN

## 2017-09-14 NOTE — Progress Notes (Signed)
Letter  

## 2017-09-14 NOTE — Patient Instructions (Addendum)
Medication Instructions:  Your physician has recommended you make the following change in your medication: 1. INCREASE Carvedilol to 12.5 mg TWICE daily  Labwork: None ordered  Testing/Procedures: None ordered  Follow-Up: Your physician recommends that you schedule a follow-up appointment in: 3 months with Dr. Elberta Fortisamnitz.   * If you need a refill on your cardiac medications before your next appointment, please call your pharmacy.   *Please note that any paperwork needing to be filled out by the provider will need to be addressed at the front desk prior to seeing the provider. Please note that any FMLA, disability or other documents regarding health condition is subject to a $25.00 charge that must be received prior to completion of paperwork in the form of a money order or check.  Thank you for choosing [facility]!!   Dory HornSherri Jane Broughton, RN [phone]  Any Other Special Instructions Will Be Listed Below (If Applicable).

## 2017-09-14 NOTE — Progress Notes (Signed)
Electrophysiology Office Note   Date:  09/14/2017   ID:  Austin Bush, DOB 03-07-1956, MRN [id number]  PCP:  Gordan Payment., MD  Cardiologist:  Dulce Sellar Primary Electrophysiologist:  Aino Heckert Jorja Loa, MD    Chief Complaint  Patient presents with  . Defib Check    Chronic systolic CHF/VF     History of Present Illness: Austin Bush is a 62 y.o. male who is being seen today for the evaluation of CHF, ICD at the request of Gordan Payment., MD. Presenting today for electrophysiology evaluation. He has a history of coronary artery disease status post PCI to the LAD in 2016, ischemic cardiomyopathy with an EF of 25%, NYHA class II heart failure, COPD, diabetes, hyperlipidemia, hypertension. He had cardiac arrest in 2009 and is status post Biotronik dual-chamber ICD status post ICD replacement and ICD generator change in 2012 due to lead malfunction resulting in inappropriate shocks. Echo 2013 showed an EF of 55-60%. Repeat echo in 2016 showed an EF of 25-30%. Most recent echo showed an EF of 45%.He has had a total of 2-lead revisions. The first was 3 years after his device was implanted, the second was 3 years after that. The left-sided device was abandoned and the device was moved to the right.  Today, denies symptoms of palpitations, chest pain, shortness of breath, orthopnea, PND, lower extremity edema, claudication, dizziness, presyncope, syncope, bleeding, or neurologic sequela. The patient is tolerating medications without difficulties.  Feels well today.  In May, he had multiple episodes of VT that were treated with ATP.  He was put on amiodarone.  Since that time, he has done well and has had no further episodes of tachycardia.   Past Medical History:  Diagnosis Date  . AICD present, double chamber 04/23/2015  . Anemia of unknown etiology 09/17/2016  . BPH (benign prostatic hyperplasia) 04/23/2015  . CAD (coronary artery disease) 04/16/2014   PCI to the LAD   . Cardiac arrhythmia  04/23/2015  . Cardiomyopathy (HCC) 12/12/2014  . Cardiomyopathy, ischemic 04/16/2014   EF 25%   . Cervical disc disease 04/23/2015  . Chronic systolic CHF (congestive heart failure), NYHA class 2 (HCC) 09/09/2016  . COPD (chronic obstructive pulmonary disease) (HCC) 04/23/2015  . Coronary artery disease involving native coronary artery of native heart with angina pectoris (HCC) 04/16/2014   PCI to the LAD  Overview:  a. Cardiac cath 02/03/2012 Severe progression of CAD, albeit with patent stents in the proximal mid LAD and mid to distal circumflex with prior intervention in 2009. Now featuring high-grade ulcerative stenosis at the junction of the proximal and mid RCA, high-grade stenosis at the origin of posterior descending branch involving the very distal portion of the RCA proximal to the bifurcation. 90% stenosis in the mid circumflex above the previously placed stent and 98% stenosis and a large very high takeoff diagonal branch of LAD coronary artery. Very distal LAD apical disease. b. reported catheterization 06/2015 without disease requiring intervention  Overview:  inferoposterior MI June 2009; 12 Sep 2009 with out of hospital SCD, hypothermia and PCI at [facility]  Cardiac cath 07/28/15: Conclusions Diagnostic Procedure Summary Mild stenosis of the Circumflex artery Otherwise no obstructive coronary artery disease. Segmental LV dysfunction of inferior wall -moderate hypokinesis   . Diabetes, polyneuropathy (HCC) 04/24/2015  . 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 Dyslipidemia 04/16/2014  . Encounter for long-term (current) use of high-risk medication  04/23/2015  . Encounter for screening for malignant neoplasm 04/23/2015  . Essential hypertension 04/16/2014  . Fatty liver 04/23/2015  . Gastro-esophageal reflux 04/23/2015  . High risk medication use 09/15/2016  . History of sudden cardiac arrest 04/16/2014    08/2007   . Implanted defibrillator electrode lead fracture 04/23/2015  . Insomnia 04/23/2015  . Ischemic cardiomyopathy 04/16/2014   EF 25%  Overview:  a. EF of 30% after myocardial infarction and cardiac arrest in 2009.  b. S/P dual-chamber ICD in 2009.  c. S/P ICD replacement and ICD generator change in 2012 due to lead malfunction resulting and 32 ICD shocks.  d. Echo 8/13.2013 TDS. LV chamber size is normal. Global LV wall motion and contractility are within normal limits. Estimated EF 55-60%. RV is mildly dilated. RV global systolic function is mildly reduced. Trace MR. Trace TR.  e. S/P system explant from the left with reimplantation on the right side February 1st 2016.  f. Echo 10/31/2014 LA 4.42cm. EF 25-30%. LV chamber size is mildly dilated. E:A reversal in the MV flow pattern suggestive of diastolic dysfunction. LA is mild to moderately dilated. RV is mildly dilated. RV global systolic function is mildly reduced. Mild AR, MR, TR, trace PR. Trivial pericardial effusion is visualized IVC is dilated.  . Lung nodule 04/23/2015  . Mixed hyperlipidemia 02/27/2016   Overview:  a. Statin therapy.   . Nocturnal leg cramps 09/15/2016  . Obesity (BMI 30.0-34.9) 04/16/2014  . Obstructive sleep apnea syndrome 04/16/2014  . Old MI (myocardial infarction) 09/09/2016  . OSA on CPAP 04/16/2014  . Osteoarthritis 04/23/2015  . PVC (premature ventricular contraction) 02/27/2016  . S/P implantation of automatic cardioverter/defibrillator (AICD) 04/16/2014   Biotronik - history of inappropriate shocks, at least 2 lead replacements   . Vitamin D deficiency 04/23/2015   Past Surgical History:  Procedure Laterality Date  . CARDIAC CATHETERIZATION    . INSERT / REPLACE / REMOVE PACEMAKER     Biotronik AICD  . KNEE SURGERY    . UMBILICAL HERNIA REPAIR       Current Outpatient Medications  Medication Sig Dispense Refill  . amiodarone (PACERONE) 200 MG tablet Take 2 tablets (400 mg total) by mouth 2 (two) times daily for  14 days, THEN 1 tablet (200 mg total) 2 (two) times daily for 14 days. 90 tablet 1  . amiodarone (PACERONE) 200 MG tablet Take 1 tablet (200 mg total) by mouth daily. 90 tablet 3  . aspirin EC 81 MG tablet Take 1 tablet (81 mg total) by mouth daily.    . Cholecalciferol (D 1000) 1000 units capsule Take 1,000 Units by mouth daily.     . furosemide (LASIX) 40 MG tablet Take 1 tablet (40 mg total) by mouth 2 (two) times daily. Take an extra tablet in the morning if you weigh 206 or greater 270 tablet 3  . gabapentin (NEURONTIN) 400 MG capsule Take 800 mg by mouth 3 (three) times daily.     Marland Kitchen KLOR-CON M20 20 MEQ tablet TAKE 2 TABLETS EVERY DAY 180 tablet 2  . metFORMIN (GLUCOPHAGE) 500 MG tablet Take 500 mg by mouth 2 (two) times daily with a meal.     . methocarbamol (ROBAXIN) 500 MG tablet Take 1,000 mg by mouth 2 (two) times daily.    . Multiple Vitamin (MULTIVITAMIN) tablet Take 1 tablet by mouth daily.    . nitroGLYCERIN (NITROSTAT) 0.4 MG SL tablet Place 1 tablet (0.4 mg total) under the tongue every 5 (five) minutes  as needed for chest pain. 25 tablet 2  . Omega-3 Fatty Acids (FISH OIL) 1200 MG CPDR Take 1,200 mg by mouth 3 (three) times daily.    Marland Kitchen. omeprazole (PRILOSEC) 20 MG capsule Take 20 mg by mouth 2 (two) times daily.    . pravastatin (PRAVACHOL) 40 MG tablet TAKE 1 TABLET EVERY DAY 90 tablet 3  . sacubitril-valsartan (ENTRESTO) 24-26 MG Take 1 tablet by mouth 2 (two) times daily. 60 tablet 11  . carvedilol (COREG) 12.5 MG tablet Take 1 tablet (12.5 mg total) by mouth 2 (two) times daily. 180 tablet 3   No current facility-administered medications for this visit.     Allergies:   Patient has no known allergies.   Social History:  The patient  reports that he has quit smoking. He quit after 30.00 years of use. He has never used smokeless tobacco. He reports that he drinks about 0.6 oz of alcohol per week. He reports that he does not use drugs.   Family History:  The patient's family  history includes Heart attack in his maternal grandfather; Hypertension in his father; Lung cancer in his father.    ROS:  Please see the history of present illness.   Otherwise, review of systems is positive for none.   All other systems are reviewed and negative.   PHYSICAL EXAM: VS:  BP 133/76   Pulse 89   Ht 6' (1.829 m)   Wt 216 lb 12.8 oz (98.3 kg)   SpO2 95%   BMI 29.40 kg/m  , BMI Body mass index is 29.4 kg/m. GEN: Well nourished, well developed, in no acute distress  HEENT: normal  Neck: no JVD, carotid bruits, or masses Cardiac: RRR; no murmurs, rubs, or gallops,no edema  Respiratory:  clear to auscultation bilaterally, normal work of breathing GI: soft, nontender, nondistended, + BS MS: no deformity or atrophy  Skin: warm and dry, device site well healed Neuro:  Strength and sensation are intact Psych: euthymic mood, full affect  EKG:  EKG is not ordered today. Personal review of the ekg ordered 08/26/17 shows SR, PVCs  Personal review of the device interrogation today. Results in [facility]    Recent Labs: 08/26/2017: BUN 15; Creatinine, Ser 0.88; Magnesium 2.1; NT-Pro BNP 88; Potassium 3.9; Sodium 140    Lipid Panel  No results found for: CHOL, TRIG, HDL, CHOLHDL, VLDL, LDLCALC, LDLDIRECT   Wt Readings from Last 3 Encounters:  09/14/17 216 lb 12.8 oz (98.3 kg)  08/26/17 218 lb (98.9 kg)  06/15/17 201 lb (91.2 kg)      Other studies Reviewed: Additional studies/ records that were reviewed today include: Cardiac cath 07/28/15 Review of the above records today demonstrates:  LMCA: Normal appearance with 0% stenosis. LAD: Normal appearance with 0% stenosis. LCx: Lesion on Mid CX: 20% stenosis 14 mm length . RCA: Normal appearance with 0% stenosis.   ASSESSMENT AND PLAN:  1.  Chronic systolic heart failure due to ischemic cardiomyopathy: Status post Biotronik ICD.  He is currently on optimal medical therapy.  He is at baseline mildly tachycardic and thus  Srinivas Lippman put him on 12.5 mg of Coreg twice a day.  He is also on amiodarone.  2. PVCs: Currently asymptomatic  3. Hypertension: Well-controlled.  No changes.  4. Hyperlipidemia: Continue pravastatin  5.  Ventricular tachycardia: Found on device interrogation and has been started on amiodarone.  I did tell him no driving.  He has had no further VT since amiodarone has been started.  Current medicines are reviewed at length with the patient today.   The patient does not have concerns regarding his medicines.  The following changes were made today:  Increase coreg  Labs/ tests ordered today include:  No orders of the defined types were placed in this encounter.    Disposition:   FU with Wrenly Lauritsen 3 months  Signed, Ilaria Much Jorja Loa, MD  09/14/2017 5:03 PM     [facility] [address] [place] [address] [phone] (office) [phone] (fax)

## 2017-09-20 ENCOUNTER — Telehealth: Payer: Self-pay | Admitting: Cardiology

## 2017-09-20 NOTE — Telephone Encounter (Signed)
New Message:      Pt c/o medication issue:  1. Name of Medication: Pt's wife is not sure of the name of the medication. She states he started the medication about two weeks ago  2. How are you currently taking this medication (dosage and times per day)?   3. Are you having a reaction (difficulty breathing--STAT)? No  4. What is your medication issue? Pt's wife states the pt is having some twitching since he has started this medication. Pt's wife also states if we could call back after 3:00 today to talk to the pt.

## 2017-09-20 NOTE — Telephone Encounter (Signed)
Pt reports experiencing hand tremors since starting Amiodarone earlier this month for VT episodes found on ICD monitoring. Pt currently taking Amiodarone 200 mg BID. Discussed w/ Dr. Elberta Fortisamnitz. Advised pt to reduce Amiodarone to 1 tablet (200 mg total) once daily. Advised to call the office, in 1-2 weeks, if he continues to experience tremors after reducing medication. Pt is agreeable to this plan and appreciates our guidance.

## 2017-10-04 ENCOUNTER — Other Ambulatory Visit: Payer: Self-pay | Admitting: Cardiology

## 2017-10-07 ENCOUNTER — Ambulatory Visit: Payer: Medicare HMO | Admitting: Cardiology

## 2017-10-07 ENCOUNTER — Encounter: Payer: Self-pay | Admitting: Cardiology

## 2017-10-07 VITALS — BP 110/64 | HR 68 | Ht 72.0 in | Wt 216.0 lb

## 2017-10-07 DIAGNOSIS — I493 Ventricular premature depolarization: Secondary | ICD-10-CM | POA: Diagnosis not present

## 2017-10-07 DIAGNOSIS — I5042 Chronic combined systolic (congestive) and diastolic (congestive) heart failure: Secondary | ICD-10-CM | POA: Diagnosis not present

## 2017-10-07 DIAGNOSIS — Z95810 Presence of automatic (implantable) cardiac defibrillator: Secondary | ICD-10-CM

## 2017-10-07 DIAGNOSIS — Z79899 Other long term (current) drug therapy: Secondary | ICD-10-CM | POA: Diagnosis not present

## 2017-10-07 DIAGNOSIS — I472 Ventricular tachycardia, unspecified: Secondary | ICD-10-CM

## 2017-10-07 DIAGNOSIS — I25119 Atherosclerotic heart disease of native coronary artery with unspecified angina pectoris: Secondary | ICD-10-CM

## 2017-10-07 DIAGNOSIS — I110 Hypertensive heart disease with heart failure: Secondary | ICD-10-CM | POA: Diagnosis not present

## 2017-10-07 DIAGNOSIS — E785 Hyperlipidemia, unspecified: Secondary | ICD-10-CM

## 2017-10-07 NOTE — Patient Instructions (Signed)
Medication Instructions:  Your physician recommends that you continue on your current medications as directed. Please refer to the Current Medication list given to you today.   Labwork: Your physician recommends that you have labs drawn today.   Testing/Procedures: NONE  Follow-Up: Your physician wants you to follow-up in: 3 months.  You will receive a reminder letter in the mail two months in advance. If you don't receive a letter, please call our office to schedule the follow-up appointment.   Any Other Special Instructions Will Be Listed Below (If Applicable).     If you need a refill on your cardiac medications before your next appointment, please call your pharmacy.

## 2017-10-07 NOTE — Progress Notes (Signed)
Cardiology Office Note:    Date:  10/07/2017   ID:  Brailon Don, DOB Jul 03, 1955, MRN [id number]  PCP:  Raina Mina., MD  Cardiologist:  Shirlee More, MD    Referring MD: Raina Mina., MD    ASSESSMENT:    1. VT (ventricular tachycardia) ([place])   2. On amiodarone therapy   3. 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   4. Coronary artery disease involving native coronary artery of native heart with angina pectoris ([place])   5. Chronic combined systolic and diastolic heart failure ([place])   6. Hypertensive heart disease with heart failure ([place])   7. Hyperlipidemia, unspecified hyperlipidemia type   8. PVC (premature ventricular contraction)    PLAN:    In order of problems listed above:  1. Stable no recurrence on amiodarone continue check baseline labs to screen for toxicity and see back in my office in 6 months. 2. Continue low-dose amiodarone 3. Stable followed in our device clinic 4. Stable continue medical treatment 5. Stable he is well compensated New [place] Heart Association class I to class II has no edema and will continue his current diuretic 6. Stable blood pressure target continue current treatment 7. Stable continue his statin with coronary artery disease   Next appointment: 3 months   Medication Adjustments/Labs and Tests Ordered: Current medicines are reviewed at length with the patient today.  Concerns regarding medicines are outlined above.  Orders Placed This Encounter  Procedures  . Lipid Profile  . Comp Met (CMET)  . TSH  . T4  . Amiodarone level   No orders of the defined types were placed in this encounter.   Chief Complaint  Patient presents with  . Congestive Heart Failure  . Coronary Artery Disease  . Hypertension  . Hyperlipidemia  . Ventricular Septal Defect  . Follow-up    now on amiodarone    History of Present Illness:    Austin Bush is a 62 y.o. male with a hx of SCD/VF, ICD,PVC's,CAD, CHFwith most  recent EF 45%, Dyslipidemia, HTN, S/P PCI last seen 08/26/17 and initiated on amiodarone for VT recurrence.  He was seen in consultation by EP he has had no recurrent ventricular tachycardia since he has been placed on amiodarone and a normal device function.  His beta-blocker dose was increased at the time of that visit Compliance with diet, lifestyle and medications: Yes Past Medical History:  Diagnosis Date  . AICD present, double chamber 04/23/2015  . Anemia of unknown etiology 09/17/2016  . BPH (benign prostatic hyperplasia) 04/23/2015  . CAD (coronary artery disease) 04/16/2014   PCI to the LAD   . Cardiac arrhythmia 04/23/2015  . Cardiomyopathy ([place]) 12/12/2014  . Cardiomyopathy, ischemic 04/16/2014   EF 25%   . Cervical disc disease 04/23/2015  . Chronic systolic CHF (congestive heart failure), NYHA class 2 ([place]) 09/09/2016  . COPD (chronic obstructive pulmonary disease) ([place]) 04/23/2015  . Coronary artery disease involving native coronary artery of native heart with angina pectoris ([place]) 04/16/2014   PCI to the LAD  Overview:  a. Cardiac cath 02/03/2012 Severe progression of CAD, albeit with patent stents in the proximal mid LAD and mid to distal circumflex with prior intervention in 2009. Now featuring high-grade ulcerative stenosis at the junction of the proximal and mid RCA, high-grade stenosis at the origin of posterior descending branch involving the very distal portion of the RCA proximal to the bifurcation. 90% stenosis in the mid circumflex above the previously placed stent and 98%  stenosis and a large very high takeoff diagonal branch of LAD coronary artery. Very distal LAD apical disease. b. reported catheterization 06/2015 without disease requiring intervention  Overview:  inferoposterior MI June 2009; 12 Sep 2009 with out of hospital SCD, hypothermia and PCI at [facility]  Cardiac cath 07/28/15: Conclusions Diagnostic Procedure Summary Mild stenosis of the Circumflex artery Otherwise no obstructive  coronary artery disease. Segmental LV dysfunction of inferior wall -moderate hypokinesis   . Diabetes, polyneuropathy ([place]) 04/24/2015  . 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 Dyslipidemia 04/16/2014  . Encounter for long-term (current) use of high-risk medication 04/23/2015  . Encounter for screening for malignant neoplasm 04/23/2015  . Essential hypertension 04/16/2014  . Fatty liver 04/23/2015  . Gastro-esophageal reflux 04/23/2015  . High risk medication use 09/15/2016  . History of sudden cardiac arrest 04/16/2014   08/2007   . Implanted defibrillator electrode lead fracture 04/23/2015  . Insomnia 04/23/2015  . Ischemic cardiomyopathy 04/16/2014   EF 25%  Overview:  a. EF of 30% after myocardial infarction and cardiac arrest in 2009.  b. S/P dual-chamber ICD in 2009.  c. S/P ICD replacement and ICD generator change in 2012 due to lead malfunction resulting and 32 ICD shocks.  d. Echo 8/13.2013 TDS. LV chamber size is normal. Global LV wall motion and contractility are within normal limits. Estimated EF 55-60%. RV is mildly dilated. RV global systolic function is mildly reduced. Trace MR. Trace TR.  e. S/P system explant from the left with reimplantation on the right side February 1st 2016.  f. Echo 10/31/2014 LA 4.42cm. EF 25-30%. LV chamber size is mildly dilated. E:A reversal in the MV flow pattern suggestive of diastolic dysfunction. LA is mild to moderately dilated. RV is mildly dilated. RV global systolic function is mildly reduced. Mild AR, MR, TR, trace PR. Trivial pericardial effusion is visualized IVC is dilated.  . Lung nodule 04/23/2015  . Mixed hyperlipidemia 02/27/2016   Overview:  a. Statin therapy.   . Nocturnal leg cramps 09/15/2016  . Obesity (BMI 30.0-34.9) 04/16/2014  . Obstructive sleep apnea syndrome 04/16/2014  . Old MI (myocardial infarction) 09/09/2016  . OSA on CPAP 04/16/2014  .  Osteoarthritis 04/23/2015  . PVC (premature ventricular contraction) 02/27/2016  . S/P implantation of automatic cardioverter/defibrillator (AICD) 04/16/2014   Biotronik - history of inappropriate shocks, at least 2 lead replacements   . Vitamin D deficiency 04/23/2015    Past Surgical History:  Procedure Laterality Date  . CARDIAC CATHETERIZATION    . INSERT / REPLACE / [place]  . KNEE SURGERY    . UMBILICAL HERNIA REPAIR      Current Medications: Current Meds  Medication Sig  . amiodarone (PACERONE) 200 MG tablet Take 1 tablet (200 mg total) by mouth daily.  Marland Kitchen aspirin EC 81 MG tablet Take 1 tablet (81 mg total) by mouth daily.  . carvedilol (COREG) 12.5 MG tablet Take 1 tablet (12.5 mg total) by mouth 2 (two) times daily.  . Cholecalciferol (D 1000) 1000 units capsule Take 1,000 Units by mouth daily.   . furosemide (LASIX) 40 MG tablet Take 1 tablet (40 mg total) by mouth 2 (two) times daily. Take an extra tablet in the morning if you weigh 206 or greater  . gabapentin (NEURONTIN) 400 MG capsule Take 800 mg by mouth 3 (three) times daily.   Marland Kitchen KLOR-CON M20 20  MEQ tablet TAKE 2 TABLETS EVERY DAY  . metFORMIN (GLUCOPHAGE) 500 MG tablet Take 500 mg by mouth 2 (two) times daily with a meal.   . methocarbamol (ROBAXIN) 500 MG tablet Take 1,000 mg by mouth 2 (two) times daily.  . Multiple Vitamin (MULTIVITAMIN) tablet Take 1 tablet by mouth daily.  . nitroGLYCERIN (NITROSTAT) 0.4 MG SL tablet Place 1 tablet (0.4 mg total) under the tongue every 5 (five) minutes as needed for chest pain.  . Omega-3 Fatty Acids (FISH OIL) 1200 MG CPDR Take 1,200 mg by mouth 3 (three) times daily.  Marland Kitchen omeprazole (PRILOSEC) 20 MG capsule Take 20 mg by mouth 2 (two) times daily.  . pravastatin (PRAVACHOL) 40 MG tablet TAKE 1 TABLET EVERY DAY  . sacubitril-valsartan (ENTRESTO) 24-26 MG Take 1 tablet by mouth 2 (two) times daily.     Allergies:   Patient has no known allergies.   Social  History   Socioeconomic History  . Marital status: Married    Spouse name: Not on file  . Number of children: Not on file  . Years of education: Not on file  . Highest education level: Not on file  Occupational History  . Not on file  Social Needs  . Financial resource strain: Not on file  . Food insecurity:    Worry: Not on file    Inability: Not on file  . Transportation needs:    Medical: Not on file    Non-medical: Not on file  Tobacco Use  . Smoking status: Former Smoker    Years: 30.00  . Smokeless tobacco: Never Used  Substance and Sexual Activity  . Alcohol use: Yes    Alcohol/week: 0.6 oz    Types: 1 Glasses of wine per week  . Drug use: No  . Sexual activity: Not on file  Lifestyle  . Physical activity:    Days per week: Not on file    Minutes per session: Not on file  . Stress: Not on file  Relationships  . Social connections:    Talks on phone: Not on file    Gets together: Not on file    Attends religious service: Not on file    Active member of club or organization: Not on file    Attends meetings of clubs or organizations: Not on file    Relationship status: Not on file  Other Topics Concern  . Not on file  Social History Narrative  . Not on file     Family History: The patient's family history includes Heart attack in his maternal grandfather; Hypertension in his father; Lung cancer in his father. ROS:   Please see the history of present illness.    All other systems reviewed and are negative.  EKGs/Labs/Other Studies Reviewed:    The following studies were reviewed today:    Recent Labs: 08/26/2017: BUN 15; Creatinine, Ser 0.88; Magnesium 2.1; NT-Pro BNP 88; Potassium 3.9; Sodium 140  Recent Lipid Panel No results found for: CHOL, TRIG, HDL, CHOLHDL, VLDL, LDLCALC, LDLDIRECT  Physical Exam:    VS:  BP 110/64 (BP Location: Right Arm, Patient Position: Sitting, Cuff Size: Normal)   Pulse 68   Ht 6' (1.829 m)   Wt 216 lb (98 kg)   SpO2  98%   BMI 29.29 kg/m     Wt Readings from Last 3 Encounters:  10/07/17 216 lb (98 kg)  09/14/17 216 lb 12.8 oz (98.3 kg)  08/26/17 218 lb (98.9 kg)  GEN:  Well nourished, well developed in no acute distress HEENT: Normal NECK: No JVD; No carotid bruits LYMPHATICS: No lymphadenopathy CARDIAC: RRR, no murmurs, rubs, gallops RESPIRATORY:  Clear to auscultation without rales, wheezing or rhonchi  ABDOMEN: Soft, non-tender, non-distended MUSCULOSKELETAL:  No edema; No deformity  SKIN: Warm and dry NEUROLOGIC:  Alert and oriented x 3 PSYCHIATRIC:  Normal affect    Signed, Shirlee More, MD  10/07/2017 10:03 AM    [place]

## 2017-10-11 LAB — AMIODARONE LEVEL
Amiodarone, Serum: 0.8 ug/mL — ABNORMAL LOW (ref 1.0–2.5)
N-DESETHYL-AMIODARONE: 0.7 ug/mL — AB (ref 1.0–2.5)

## 2017-10-11 LAB — COMPREHENSIVE METABOLIC PANEL
A/G RATIO: 2 (ref 1.2–2.2)
ALBUMIN: 4.5 g/dL (ref 3.6–4.8)
ALT: 22 IU/L (ref 0–44)
AST: 17 IU/L (ref 0–40)
Alkaline Phosphatase: 50 IU/L (ref 39–117)
BUN / CREAT RATIO: 14 (ref 10–24)
BUN: 14 mg/dL (ref 8–27)
Bilirubin Total: 0.3 mg/dL (ref 0.0–1.2)
CALCIUM: 9.4 mg/dL (ref 8.6–10.2)
CO2: 26 mmol/L (ref 20–29)
Chloride: 97 mmol/L (ref 96–106)
Creatinine, Ser: 1.03 mg/dL (ref 0.76–1.27)
GFR, EST AFRICAN AMERICAN: 90 mL/min/{1.73_m2} (ref >59)
GFR, EST NON AFRICAN AMERICAN: 78 mL/min/{1.73_m2} (ref >59)
GLOBULIN, TOTAL: 2.3 g/dL (ref 1.5–4.5)
Glucose: 107 mg/dL — ABNORMAL HIGH (ref 65–99)
Potassium: 4.2 mmol/L (ref 3.5–5.2)
Sodium: 140 mmol/L (ref 134–144)
TOTAL PROTEIN: 6.8 g/dL (ref 6.0–8.5)

## 2017-10-11 LAB — T4: T4, Total: 9.5 ug/dL (ref 4.5–12.0)

## 2017-10-11 LAB — LIPID PANEL
CHOL/HDL RATIO: 2.8 ratio (ref 0.0–5.0)
CHOLESTEROL TOTAL: 182 mg/dL (ref 100–199)
HDL: 64 mg/dL (ref >39)
LDL Calculated: 102 mg/dL — ABNORMAL HIGH (ref 0–99)
Triglycerides: 81 mg/dL (ref 0–149)
VLDL Cholesterol Cal: 16 mg/dL (ref 5–40)

## 2017-10-11 LAB — TSH: TSH: 2.16 u[IU]/mL (ref 0.450–4.500)

## 2017-11-10 ENCOUNTER — Telehealth: Payer: Self-pay | Admitting: Cardiology

## 2017-11-10 DIAGNOSIS — R5381 Other malaise: Secondary | ICD-10-CM | POA: Insufficient documentation

## 2017-11-10 DIAGNOSIS — R5383 Other fatigue: Secondary | ICD-10-CM

## 2017-11-10 DIAGNOSIS — R1084 Generalized abdominal pain: Secondary | ICD-10-CM

## 2017-11-10 HISTORY — DX: Other fatigue: R53.83

## 2017-11-10 HISTORY — DX: Other malaise: R53.81

## 2017-11-10 HISTORY — DX: Generalized abdominal pain: R10.84

## 2017-11-10 MED ORDER — SACUBITRIL-VALSARTAN 24-26 MG PO TABS: 1.0000 | ORAL_TABLET | Freq: Two times a day (BID) | ORAL | 3 refills | Status: DC

## 2017-11-10 NOTE — Telephone Encounter (Signed)
[facility]

## 2017-11-10 NOTE — Telephone Encounter (Signed)
[facility]

## 2017-12-07 ENCOUNTER — Telehealth: Payer: Self-pay | Admitting: Cardiology

## 2017-12-07 ENCOUNTER — Other Ambulatory Visit: Payer: Self-pay | Admitting: Cardiology

## 2017-12-13 ENCOUNTER — Ambulatory Visit (INDEPENDENT_AMBULATORY_CARE_PROVIDER_SITE_OTHER): Payer: Medicare HMO | Admitting: *Deleted

## 2017-12-13 DIAGNOSIS — I472 Ventricular tachycardia, unspecified: Secondary | ICD-10-CM

## 2017-12-13 NOTE — Progress Notes (Signed)
Remote ICD transmission.   

## 2017-12-14 ENCOUNTER — Encounter: Payer: Self-pay | Admitting: Cardiology

## 2017-12-19 ENCOUNTER — Encounter: Payer: Medicare HMO | Admitting: Cardiology

## 2018-01-02 ENCOUNTER — Encounter: Payer: Medicare HMO | Admitting: Cardiology

## 2018-01-02 ENCOUNTER — Encounter: Payer: Self-pay | Admitting: Cardiology

## 2018-01-02 ENCOUNTER — Ambulatory Visit (INDEPENDENT_AMBULATORY_CARE_PROVIDER_SITE_OTHER): Payer: Medicare HMO | Admitting: Cardiology

## 2018-01-02 VITALS — BP 122/70 | HR 64 | Ht 72.0 in | Wt 219.0 lb

## 2018-01-02 DIAGNOSIS — I10 Essential (primary) hypertension: Secondary | ICD-10-CM | POA: Diagnosis not present

## 2018-01-02 DIAGNOSIS — I5022 Chronic systolic (congestive) heart failure: Secondary | ICD-10-CM | POA: Diagnosis not present

## 2018-01-02 DIAGNOSIS — I472 Ventricular tachycardia, unspecified: Secondary | ICD-10-CM

## 2018-01-02 DIAGNOSIS — I255 Ischemic cardiomyopathy: Secondary | ICD-10-CM

## 2018-01-02 DIAGNOSIS — E785 Hyperlipidemia, unspecified: Secondary | ICD-10-CM

## 2018-01-02 NOTE — Progress Notes (Signed)
Electrophysiology Office Note   Date:  01/02/2018   ID:  Casmer Yepiz, DOB 15-Jul-1955, MRN [id number]  PCP:  Gordan Payment., MD  Cardiologist:  Dulce Sellar Primary Electrophysiologist:  Makai Dumond Jorja Loa, MD    No chief complaint on file.    History of Present Illness: Austin Bush is a 62 y.o. male who is being seen today for the evaluation of CHF, ICD at the request of Gordan Payment., MD. Presenting today for electrophysiology evaluation. He has a history of coronary artery disease status post PCI to the LAD in 2016, ischemic cardiomyopathy with an EF of 25%, NYHA class II heart failure, COPD, diabetes, hyperlipidemia, hypertension. He had cardiac arrest in 2009 and is status post Biotronik dual-chamber ICD status post ICD replacement and ICD generator change in 2012 due to lead malfunction resulting in inappropriate shocks. Echo 2013 showed an EF of 55-60%. Repeat echo in 2016 showed an EF of 25-30%. Most recent echo showed an EF of 45%.He has had a total of 2-lead revisions. The first was 3 years after his device was implanted, the second was 3 years after that. The left-sided device was abandoned and the device was moved to the right.  Today, denies symptoms of palpitations, chest pain, shortness of breath, orthopnea, PND, lower extremity edema, claudication, dizziness, presyncope, syncope, bleeding, or neurologic sequela. The patient is tolerating medications without difficulties.  Overall he is feeling well.  He is noted no palpitations.  His respiratory status is stable.   Past Medical History:  Diagnosis Date  . AICD present, double chamber 04/23/2015  . Anemia of unknown etiology 09/17/2016  . BPH (benign prostatic hyperplasia) 04/23/2015  . CAD (coronary artery disease) 04/16/2014   PCI to the LAD   . Cardiac arrhythmia 04/23/2015  . Cardiomyopathy (HCC) 12/12/2014  . Cardiomyopathy, ischemic 04/16/2014   EF 25%   . Cervical disc disease 04/23/2015  . Chronic systolic CHF  (congestive heart failure), NYHA class 2 (HCC) 09/09/2016  . COPD (chronic obstructive pulmonary disease) (HCC) 04/23/2015  . Coronary artery disease involving native coronary artery of native heart with angina pectoris (HCC) 04/16/2014   PCI to the LAD  Overview:  a. Cardiac cath 02/03/2012 Severe progression of CAD, albeit with patent stents in the proximal mid LAD and mid to distal circumflex with prior intervention in 2009. Now featuring high-grade ulcerative stenosis at the junction of the proximal and mid RCA, high-grade stenosis at the origin of posterior descending branch involving the very distal portion of the RCA proximal to the bifurcation. 90% stenosis in the mid circumflex above the previously placed stent and 98% stenosis and a large very high takeoff diagonal branch of LAD coronary artery. Very distal LAD apical disease. b. reported catheterization 06/2015 without disease requiring intervention  Overview:  inferoposterior MI June 2009; 12 Sep 2009 with out of hospital SCD, hypothermia and PCI at [facility]  Cardiac cath 07/28/15: Conclusions Diagnostic Procedure Summary Mild stenosis of the Circumflex artery Otherwise no obstructive coronary artery disease. Segmental LV dysfunction of inferior wall -moderate hypokinesis   . Diabetes, polyneuropathy (HCC) 04/24/2015  . 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 Dyslipidemia 04/16/2014  . Encounter for long-term (current) use of high-risk medication 04/23/2015  . Encounter for screening for malignant neoplasm 04/23/2015  . Essential hypertension 04/16/2014  . Fatty liver 04/23/2015  . Gastro-esophageal reflux 04/23/2015  . High risk medication use 09/15/2016  .  History of sudden cardiac arrest 04/16/2014   08/2007   . Implanted defibrillator electrode lead fracture 04/23/2015  . Insomnia 04/23/2015  . Ischemic cardiomyopathy 04/16/2014   EF 25%  Overview:  a.  EF of 30% after myocardial infarction and cardiac arrest in 2009.  b. S/P dual-chamber ICD in 2009.  c. S/P ICD replacement and ICD generator change in 2012 due to lead malfunction resulting and 32 ICD shocks.  d. Echo 8/13.2013 TDS. LV chamber size is normal. Global LV wall motion and contractility are within normal limits. Estimated EF 55-60%. RV is mildly dilated. RV global systolic function is mildly reduced. Trace MR. Trace TR.  e. S/P system explant from the left with reimplantation on the right side February 1st 2016.  f. Echo 10/31/2014 LA 4.42cm. EF 25-30%. LV chamber size is mildly dilated. E:A reversal in the MV flow pattern suggestive of diastolic dysfunction. LA is mild to moderately dilated. RV is mildly dilated. RV global systolic function is mildly reduced. Mild AR, MR, TR, trace PR. Trivial pericardial effusion is visualized IVC is dilated.  . Lung nodule 04/23/2015  . Mixed hyperlipidemia 02/27/2016   Overview:  a. Statin therapy.   . Nocturnal leg cramps 09/15/2016  . Obesity (BMI 30.0-34.9) 04/16/2014  . Obstructive sleep apnea syndrome 04/16/2014  . Old MI (myocardial infarction) 09/09/2016  . OSA on CPAP 04/16/2014  . Osteoarthritis 04/23/2015  . PVC (premature ventricular contraction) 02/27/2016  . S/P implantation of automatic cardioverter/defibrillator (AICD) 04/16/2014   Biotronik - history of inappropriate shocks, at least 2 lead replacements   . Vitamin D deficiency 04/23/2015   Past Surgical History:  Procedure Laterality Date  . CARDIAC CATHETERIZATION    . INSERT / REPLACE / REMOVE PACEMAKER     Biotronik AICD  . KNEE SURGERY    . UMBILICAL HERNIA REPAIR       Current Outpatient Medications  Medication Sig Dispense Refill  . amiodarone (PACERONE) 200 MG tablet Take 1 tablet (200 mg total) by mouth daily. 90 tablet 3  . aspirin EC 81 MG tablet Take 1 tablet (81 mg total) by mouth daily.    . carvedilol (COREG) 12.5 MG tablet Take 1 tablet (12.5 mg total) by mouth 2 (two)  times daily. 180 tablet 3  . Cholecalciferol (D 1000) 1000 units capsule Take 1,000 Units by mouth daily.     . furosemide (LASIX) 40 MG tablet Take 1 tablet (40 mg total) by mouth 2 (two) times daily. Take an extra tablet in the morning if you weigh 206 or greater 270 tablet 3  . gabapentin (NEURONTIN) 400 MG capsule Take 800 mg by mouth 3 (three) times daily.     Marland Kitchen KLOR-CON M20 20 MEQ tablet TAKE 2 TABLETS EVERY DAY 180 tablet 2  . lisinopril (PRINIVIL,ZESTRIL) 10 MG tablet TAKE 1 TABLET EVERY DAY 90 tablet 2  . metFORMIN (GLUCOPHAGE) 500 MG tablet Take 500 mg by mouth 2 (two) times daily with a meal.     . methocarbamol (ROBAXIN) 500 MG tablet Take 1,000 mg by mouth 2 (two) times daily.    . Multiple Vitamin (MULTIVITAMIN) tablet Take 1 tablet by mouth daily.    . nitroGLYCERIN (NITROSTAT) 0.4 MG SL tablet Place 1 tablet (0.4 mg total) under the tongue every 5 (five) minutes as needed for chest pain. 25 tablet 2  . Omega-3 Fatty Acids (FISH OIL) 1200 MG CPDR Take 1,200 mg by mouth 3 (three) times daily.    Marland Kitchen omeprazole (PRILOSEC) 20 MG  capsule Take 20 mg by mouth 2 (two) times daily.    . pravastatin (PRAVACHOL) 40 MG tablet TAKE 1 TABLET EVERY DAY 90 tablet 3  . sacubitril-valsartan (ENTRESTO) 24-26 MG Take 1 tablet by mouth 2 (two) times daily. 180 tablet 3   No current facility-administered medications for this visit.     Allergies:   Patient has no known allergies.   Social History:  The patient  reports that he has quit smoking. He quit after 30.00 years of use. He has never used smokeless tobacco. He reports that he drinks about 1.0 standard drinks of alcohol per week. He reports that he does not use drugs.   Family History:  The patient's family history includes Heart attack in his maternal grandfather; Hypertension in his father; Lung cancer in his father.    ROS:  Please see the history of present illness.   Otherwise, review of systems is positive for none.   All other systems  are reviewed and negative.   PHYSICAL EXAM: VS:  BP 122/70   Pulse 64   Ht 6' (1.829 m)   Wt 219 lb (99.3 kg)   BMI 29.70 kg/m  , BMI Body mass index is 29.7 kg/m. GEN: Well nourished, well developed, in no acute distress  HEENT: normal  Neck: no JVD, carotid bruits, or masses Cardiac: RRR; no murmurs, rubs, or gallops,no edema  Respiratory:  clear to auscultation bilaterally, normal work of breathing GI: soft, nontender, nondistended, + BS MS: no deformity or atrophy  Skin: warm and dry, device site well healed Neuro:  Strength and sensation are intact Psych: euthymic mood, full affect  EKG:  EKG is ordered today. Personal review of the ekg ordered shows this rhythm, inferior Q waves possible inferior infarct, rate 64  Personal review of the device interrogation today. Results in [facility]    Recent Labs: 08/26/2017: Magnesium 2.1; NT-Pro BNP 88 10/07/2017: ALT 22; BUN 14; Creatinine, Ser 1.03; Potassium 4.2; Sodium 140; TSH 2.160    Lipid Panel     Component Value Date/Time   CHOL 182 10/07/2017 0911   TRIG 81 10/07/2017 0911   HDL 64 10/07/2017 0911   CHOLHDL 2.8 10/07/2017 0911   LDLCALC 102 (H) 10/07/2017 0911     Wt Readings from Last 3 Encounters:  01/02/18 219 lb (99.3 kg)  10/07/17 216 lb (98 kg)  09/14/17 216 lb 12.8 oz (98.3 kg)      Other studies Reviewed: Additional studies/ records that were reviewed today include: Cardiac cath 07/28/15 Review of the above records today demonstrates:  LMCA: Normal appearance with 0% stenosis. LAD: Normal appearance with 0% stenosis. LCx: Lesion on Mid CX: 20% stenosis 14 mm length . RCA: Normal appearance with 0% stenosis.   ASSESSMENT AND PLAN:  1.  Chronic systolic heart failure due to ischemic cardiomyopathy: Biotronik ICD.  Currently on optimal medical therapy.  No signs of volume overload.  No changes.  2. PVCs: Currently asymptomatic  3. Hypertension: Well-controlled, no changes  4. Hyperlipidemia:  Continue pravastatin  5.  Ventricular tachycardia: Found on device interrogation.  Has been started on amiodarone.  No further episodes.   Current medicines are reviewed at length with the patient today.   The patient does not have concerns regarding his medicines.  The following changes were made today: None  Labs/ tests ordered today include:  Orders Placed This Encounter  Procedures  . EKG 12-Lead     Disposition:   FU with Kolton Kienle 6 months  Signed, Arleen Bar Jorja Loa, MD  01/02/2018 2:55 PM     [facility] [address] [place] [address] [phone] (office) [phone] (fax)

## 2018-01-03 LAB — CUP PACEART REMOTE DEVICE CHECK
Date Time Interrogation Session: 20191015080458
Implantable Lead Location: 753859
Implantable Lead Model: 379
Implantable Lead Serial Number: 49114934
Implantable Pulse Generator Implant Date: 20160201
MDC IDC LEAD IMPLANT DT: 20160201
MDC IDC LEAD IMPLANT DT: 20160201
MDC IDC LEAD LOCATION: 753860
MDC IDC LEAD SERIAL: 29736564
Pulse Gen Serial Number: 60838040

## 2018-02-13 ENCOUNTER — Encounter: Payer: Medicare HMO | Admitting: Cardiology

## 2018-03-14 ENCOUNTER — Ambulatory Visit (INDEPENDENT_AMBULATORY_CARE_PROVIDER_SITE_OTHER): Payer: Medicare HMO

## 2018-03-14 DIAGNOSIS — I255 Ischemic cardiomyopathy: Secondary | ICD-10-CM

## 2018-03-15 LAB — CUP PACEART REMOTE DEVICE CHECK
Implantable Lead Implant Date: 20160201
Implantable Lead Location: 753859
Implantable Lead Serial Number: 29736564
Implantable Pulse Generator Implant Date: 20160201
MDC IDC LEAD IMPLANT DT: 20160201
MDC IDC LEAD LOCATION: 753860
MDC IDC LEAD SERIAL: 49114934
MDC IDC SESS DTM: 20191225194305
Pulse Gen Model: 392412
Pulse Gen Serial Number: 60838040

## 2018-03-16 NOTE — Progress Notes (Signed)
Remote ICD transmission.   

## 2018-05-15 NOTE — Progress Notes (Signed)
Cardiology Office Note:    Date:  05/16/2018   ID:  Austin Bush, DOB [birth year], MRN [id number]  PCP:  Gordan Payment., MD  Cardiologist:  Norman Herrlich, MD    Referring MD: Gordan Payment., MD    please do follow-up labs at your office visit next month including proBNP TSH and liver on amiodarone and statin CMP and lipid profile.   ASSESSMENT:    1. VT (ventricular tachycardia) (HCC)   2. AICD present, double chamber   3. Chronic combined systolic and diastolic heart failure (HCC)   4. Hypertensive heart disease with heart failure (HCC)   5. Coronary artery disease involving native coronary artery of native heart with angina pectoris (HCC)   6. Hyperlipidemia, unspecified hyperlipidemia type   7. Chronic systolic CHF (congestive heart failure), NYHA class 2 (HCC)    PLAN:    In order of problems listed above:  1. Stable no recurrence found in our device clinic continue amiodarone. Labs next month re liver and thyroid toxicity 2. Stable function followed in device clinic he finally has recovered from his anxiety from multiple ICD shocks in the past 3. Mildly decompensated he has trace to 1+ edema weight is up we will increase the dose of his diuretic from 120 160 mg of furosemide daily 4. Blood pressure target increase diuretics 5. Stable CAD [place] Heart Association class I continue current medical treatment at this time I would not pursue an ischemia evaluation 6. Stable continue with statin 7. Increase diuretic   Next appointment: 6 months   Medication Adjustments/Labs and Tests Ordered: Current medicines are reviewed at length with the patient today.  Concerns regarding medicines are outlined above.  No orders of the defined types were placed in this encounter.  Meds ordered this encounter  Medications  . furosemide (LASIX) 40 MG tablet    Sig: Take 2 tablets (80 mg total) by mouth 2 (two) times daily. Take an extra tablet in the morning if you weigh 206 or greater      Dispense:  270 tablet    Refill:  3    Chief Complaint  Patient presents with  . Follow-up    ICD  . Coronary Artery Disease  . Congestive Heart Failure    History of Present Illness:    Austin Bush is a 63 y.o. male with a hx of SCD/VF, ICD, PVC's, CAD, CHF with most recent EF 45%, Dyslipidemia, HTN, S/P PCI last seen 08/26/17 and initiated on amiodarone for VT recurrence.  He was seen in consultation by EP he has had no recurrent ventricular tachycardia since he has been placed on amiodarone and a normal device function  last seen 10/07/17. Compliance with diet, lifestyle and medications: Yes  He is pleased with the quality of his life he now walks up to 10 miles a day has had no shortness of breath chest pain palpitation or syncope was unaware he had edema but did notice his weight is up about 10 pounds.  He has finally recovered from the trauma with multiple ICD shocks several years ago. Past Medical History:  Diagnosis Date  . AICD present, double chamber 04/23/2015  . Anemia of unknown etiology 09/17/2016  . BPH (benign prostatic hyperplasia) 04/23/2015  . CAD (coronary artery disease) 04/16/2014   PCI to the LAD   . Cardiac arrhythmia 04/23/2015  . Cardiomyopathy (HCC) 12/12/2014  . Cardiomyopathy, ischemic 04/16/2014   EF 25%   . Cervical disc disease 04/23/2015  . Chronic  systolic CHF (congestive heart failure), NYHA class 2 (HCC) 09/09/2016  . COPD (chronic obstructive pulmonary disease) (HCC) 04/23/2015  . Coronary artery disease involving native coronary artery of native heart with angina pectoris (HCC) 04/16/2014   PCI to the LAD  Overview:  a. Cardiac cath 02/03/2012 Severe progression of CAD, albeit with patent stents in the proximal mid LAD and mid to distal circumflex with prior intervention in 2009. Now featuring high-grade ulcerative stenosis at the junction of the proximal and mid RCA, high-grade stenosis at the origin of posterior descending branch involving the very  distal portion of the RCA proximal to the bifurcation. 90% stenosis in the mid circumflex above the previously placed stent and 98% stenosis and a large very high takeoff diagonal branch of LAD coronary artery. Very distal LAD apical disease. b. reported catheterization 06/2015 without disease requiring intervention  Overview:  inferoposterior MI June 2009; 12 Sep 2009 with out of hospital SCD, hypothermia and PCI at [facility]  Cardiac cath 07/28/15: Conclusions Diagnostic Procedure Summary Mild stenosis of the Circumflex artery Otherwise no obstructive coronary artery disease. Segmental LV dysfunction of inferior wall -moderate hypokinesis   . Diabetes, polyneuropathy (HCC) 04/24/2015  . 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 Dyslipidemia 04/16/2014  . Encounter for long-term (current) use of high-risk medication 04/23/2015  . Encounter for screening for malignant neoplasm 04/23/2015  . Essential hypertension 04/16/2014  . Fatty liver 04/23/2015  . Gastro-esophageal reflux 04/23/2015  . High risk medication use 09/15/2016  . History of sudden cardiac arrest 04/16/2014   08/2007   . Implanted defibrillator electrode lead fracture 04/23/2015  . Insomnia 04/23/2015  . Ischemic cardiomyopathy 04/16/2014   EF 25%  Overview:  a. EF of 30% after myocardial infarction and cardiac arrest in 2009.  b. S/P dual-chamber ICD in 2009.  c. S/P ICD replacement and ICD generator change in 2012 due to lead malfunction resulting and 32 ICD shocks.  d. Echo 8/13.2013 TDS. LV chamber size is normal. Global LV wall motion and contractility are within normal limits. Estimated EF 55-60%. RV is mildly dilated. RV global systolic function is mildly reduced. Trace MR. Trace TR.  e. S/P system explant from the left with reimplantation on the right side February 1st 2016.  f. Echo 10/31/2014 LA 4.42cm. EF 25-30%. LV chamber size is mildly  dilated. E:A reversal in the MV flow pattern suggestive of diastolic dysfunction. LA is mild to moderately dilated. RV is mildly dilated. RV global systolic function is mildly reduced. Mild AR, MR, TR, trace PR. Trivial pericardial effusion is visualized IVC is dilated.  . Lung nodule 04/23/2015  . Mixed hyperlipidemia 02/27/2016   Overview:  a. Statin therapy.   . Nocturnal leg cramps 09/15/2016  . Obesity (BMI 30.0-34.9) 04/16/2014  . Obstructive sleep apnea syndrome 04/16/2014  . Old MI (myocardial infarction) 09/09/2016  . OSA on CPAP 04/16/2014  . Osteoarthritis 04/23/2015  . PVC (premature ventricular contraction) 02/27/2016  . S/P implantation of automatic cardioverter/defibrillator (AICD) 04/16/2014   Biotronik - history of inappropriate shocks, at least 2 lead replacements   . Vitamin D deficiency 04/23/2015    Past Surgical History:  Procedure Laterality Date  . CARDIAC CATHETERIZATION    . INSERT / REPLACE / REMOVE PACEMAKER     Biotronik AICD  . KNEE SURGERY    . UMBILICAL HERNIA REPAIR      Current Medications: Current Meds  Medication Sig  .  amiodarone (PACERONE) 200 MG tablet Take 1 tablet (200 mg total) by mouth daily.  Marland Kitchen aspirin EC 81 MG tablet Take 1 tablet (81 mg total) by mouth daily.  . carvedilol (COREG) 12.5 MG tablet Take 1 tablet (12.5 mg total) by mouth 2 (two) times daily.  . Cholecalciferol (D 1000) 1000 units capsule Take 1,000 Units by mouth daily.   . furosemide (LASIX) 40 MG tablet Take 2 tablets (80 mg total) by mouth 2 (two) times daily. Take an extra tablet in the morning if you weigh 206 or greater  . gabapentin (NEURONTIN) 400 MG capsule Take 800 mg by mouth 3 (three) times daily.   Marland Kitchen KLOR-CON M20 20 MEQ tablet TAKE 2 TABLETS EVERY DAY  . metFORMIN (GLUCOPHAGE) 500 MG tablet Take 500 mg by mouth 2 (two) times daily with a meal.   . methocarbamol (ROBAXIN) 500 MG tablet Take 1,000 mg by mouth 2 (two) times daily.  . Multiple Vitamin (MULTIVITAMIN) tablet  Take 1 tablet by mouth daily.  . nitroGLYCERIN (NITROSTAT) 0.4 MG SL tablet Place 1 tablet (0.4 mg total) under the tongue every 5 (five) minutes as needed for chest pain.  . Omega-3 Fatty Acids (FISH OIL) 1200 MG CPDR Take 1,200 mg by mouth 3 (three) times daily.  Marland Kitchen omeprazole (PRILOSEC) 20 MG capsule Take 20 mg by mouth 2 (two) times daily.  . pravastatin (PRAVACHOL) 40 MG tablet TAKE 1 TABLET EVERY DAY  . sacubitril-valsartan (ENTRESTO) 24-26 MG Take 1 tablet by mouth 2 (two) times daily.  . [DISCONTINUED] furosemide (LASIX) 40 MG tablet Take 1 tablet (40 mg total) by mouth 2 (two) times daily. Take an extra tablet in the morning if you weigh 206 or greater     Allergies:   Patient has no known allergies.   Social History   Socioeconomic History  . Marital status: Married    Spouse name: Not on file  . Number of children: Not on file  . Years of education: Not on file  . Highest education level: Not on file  Occupational History  . Not on file  Social Needs  . Financial resource strain: Not on file  . Food insecurity:    Worry: Not on file    Inability: Not on file  . Transportation needs:    Medical: Not on file    Non-medical: Not on file  Tobacco Use  . Smoking status: Former Smoker    Years: 30.00  . Smokeless tobacco: Never Used  Substance and Sexual Activity  . Alcohol use: Yes    Alcohol/week: 1.0 standard drinks    Types: 1 Glasses of wine per week  . Drug use: No  . Sexual activity: Not on file  Lifestyle  . Physical activity:    Days per week: Not on file    Minutes per session: Not on file  . Stress: Not on file  Relationships  . Social connections:    Talks on phone: Not on file    Gets together: Not on file    Attends religious service: Not on file    Active member of club or organization: Not on file    Attends meetings of clubs or organizations: Not on file    Relationship status: Not on file  Other Topics Concern  . Not on file  Social History  Narrative  . Not on file     Family History: The patient's family history includes Heart attack in his maternal grandfather; Hypertension in his father;  Lung cancer in his father. ROS:   Please see the history of present illness.    All other systems reviewed and are negative.  EKGs/Labs/Other Studies Reviewed:    The following studies were reviewed today:  EKG: Performed last visit stable sinus rhythm old inferior MI  Recent Labs: 11/10/2017 BNP level 21 A1c 6.3 cholesterol 161 HDL 54 LDL 100 CMP was normal creatinine 1.0 potassium 3.6 TSH normal 08/26/2017: Magnesium 2.1; NT-Pro BNP 88 10/07/2017: ALT 22; BUN 14; Creatinine, Ser 1.03; Potassium 4.2; Sodium 140; TSH 2.160  Recent Lipid Panel    Component Value Date/Time   CHOL 182 10/07/2017 0911   TRIG 81 10/07/2017 0911   HDL 64 10/07/2017 0911   CHOLHDL 2.8 10/07/2017 0911   LDLCALC 102 (H) 10/07/2017 0911    Physical Exam:    VS:  BP 112/62 (BP Location: Right Arm, Patient Position: Sitting, Cuff Size: Large)   Pulse 74   Ht 6' (1.829 m)   Wt 228 lb 1.9 oz (103.5 kg)   SpO2 97%   BMI 30.94 kg/m     Wt Readings from Last 3 Encounters:  05/16/18 228 lb 1.9 oz (103.5 kg)  01/02/18 219 lb (99.3 kg)  10/07/17 216 lb (98 kg)     GEN:  Well nourished, well developed in no acute distress HEENT: Normal NECK: No JVD; No carotid bruits LYMPHATICS: No lymphadenopathy CARDIAC: RRR, no murmurs, rubs, gallops RESPIRATORY:  Clear to auscultation without rales, wheezing or rhonchi  ABDOMEN: Soft, non-tender, non-distended MUSCULOSKELETAL: Trace to 1+ bilateral to the knee edema; No deformity  SKIN: Warm and dry NEUROLOGIC:  Alert and oriented x 3 PSYCHIATRIC:  Normal affect    Signed, Norman Herrlich, MD  05/16/2018 9:57 AM    [facility]

## 2018-05-16 ENCOUNTER — Encounter: Payer: Self-pay | Admitting: Cardiology

## 2018-05-16 ENCOUNTER — Ambulatory Visit (INDEPENDENT_AMBULATORY_CARE_PROVIDER_SITE_OTHER): Payer: Medicare HMO | Admitting: Cardiology

## 2018-05-16 VITALS — BP 112/62 | HR 74 | Ht 72.0 in | Wt 228.1 lb

## 2018-05-16 DIAGNOSIS — Z95810 Presence of automatic (implantable) cardiac defibrillator: Secondary | ICD-10-CM | POA: Diagnosis not present

## 2018-05-16 DIAGNOSIS — E785 Hyperlipidemia, unspecified: Secondary | ICD-10-CM

## 2018-05-16 DIAGNOSIS — I110 Hypertensive heart disease with heart failure: Secondary | ICD-10-CM | POA: Diagnosis not present

## 2018-05-16 DIAGNOSIS — I472 Ventricular tachycardia, unspecified: Secondary | ICD-10-CM

## 2018-05-16 DIAGNOSIS — I5042 Chronic combined systolic (congestive) and diastolic (congestive) heart failure: Secondary | ICD-10-CM

## 2018-05-16 DIAGNOSIS — I25119 Atherosclerotic heart disease of native coronary artery with unspecified angina pectoris: Secondary | ICD-10-CM

## 2018-05-16 DIAGNOSIS — I5022 Chronic systolic (congestive) heart failure: Secondary | ICD-10-CM

## 2018-05-16 MED ORDER — FUROSEMIDE 40 MG PO TABS: 80.0000 mg | ORAL_TABLET | Freq: Two times a day (BID) | ORAL | 3 refills | Status: DC

## 2018-05-16 NOTE — Patient Instructions (Signed)
Medication Instructions:  Your physician has recommended you make the following change in your medication:   START taking furosemide 80 mg (2 tablets) in morning ant 80 mg (2 tablets) in evening.  If you need a refill on your cardiac medications before your next appointment, please call your pharmacy.   Lab work: NONE If you have labs (blood work) drawn today and your tests are completely normal, you will receive your results only by: Marland Kitchen MyChart Message (if you have MyChart) OR . A paper copy in the mail If you have any lab test that is abnormal or we need to change your treatment, we will call you to review the results.  Testing/Procedures: NONE  Follow-Up: At [facility], you and your health needs are our priority.  As part of our continuing mission to provide you with exceptional heart care, we have created designated Provider Care Teams.  These Care Teams include your primary Cardiologist (physician) and Advanced Practice Providers (APPs -  Physician Assistants and Nurse Practitioners) who all work together to provide you with the care you need, when you need it. You will need a follow up appointment in 6 months.  Please call our office 2 months in advance to schedule this appointment.

## 2018-05-24 ENCOUNTER — Telehealth: Payer: Self-pay | Admitting: Cardiology

## 2018-05-24 MED ORDER — POTASSIUM CHLORIDE CRYS ER 20 MEQ PO TBCR: 40.0000 meq | EXTENDED_RELEASE_TABLET | Freq: Every day | ORAL | 1 refills | Status: DC

## 2018-05-24 NOTE — Telephone Encounter (Signed)
Call potassium to [facility]/pt is out

## 2018-05-24 NOTE — Telephone Encounter (Signed)
Refill for potassium sent to [facility] as requested.

## 2018-05-24 NOTE — Addendum Note (Signed)
Addended by: Crist Fat on: 05/24/2018 03:11 PM   Modules accepted: Orders

## 2018-05-31 ENCOUNTER — Telehealth: Payer: Self-pay | Admitting: Cardiology

## 2018-05-31 MED ORDER — SACUBITRIL-VALSARTAN 24-26 MG PO TABS: 1.0000 | ORAL_TABLET | Freq: Two times a day (BID) | ORAL | 1 refills | Status: DC

## 2018-05-31 NOTE — Telephone Encounter (Signed)
Patient needs a refill for his Entresto sent to [facility]

## 2018-05-31 NOTE — Telephone Encounter (Signed)
Refill for entresto sent to [facility] as requested.

## 2018-06-13 ENCOUNTER — Ambulatory Visit (INDEPENDENT_AMBULATORY_CARE_PROVIDER_SITE_OTHER): Payer: Medicare HMO | Admitting: *Deleted

## 2018-06-13 ENCOUNTER — Other Ambulatory Visit: Payer: Self-pay

## 2018-06-13 DIAGNOSIS — I472 Ventricular tachycardia, unspecified: Secondary | ICD-10-CM

## 2018-06-13 DIAGNOSIS — I255 Ischemic cardiomyopathy: Secondary | ICD-10-CM

## 2018-06-13 LAB — CUP PACEART REMOTE DEVICE CHECK
Date Time Interrogation Session: 20200324111248
Implantable Lead Implant Date: 20160201
Implantable Lead Implant Date: 20160201
Implantable Lead Location: 753860
Implantable Lead Model: 350
Implantable Lead Model: 379
Implantable Lead Serial Number: 29736564
Implantable Lead Serial Number: 49114934
Implantable Pulse Generator Implant Date: 20160201
MDC IDC LEAD LOCATION: 753859
Pulse Gen Model: 392412
Pulse Gen Serial Number: 60838040

## 2018-06-19 ENCOUNTER — Encounter: Payer: Self-pay | Admitting: Cardiology

## 2018-06-19 NOTE — Progress Notes (Signed)
Remote ICD transmission.   

## 2018-08-22 ENCOUNTER — Other Ambulatory Visit: Payer: Self-pay | Admitting: Cardiology

## 2018-08-22 DIAGNOSIS — I5022 Chronic systolic (congestive) heart failure: Secondary | ICD-10-CM

## 2018-08-22 MED ORDER — PRAVASTATIN SODIUM 40 MG PO TABS: 40.0000 mg | ORAL_TABLET | Freq: Every day | ORAL | 1 refills | Status: DC

## 2018-08-22 MED ORDER — CARVEDILOL 12.5 MG PO TABS: 12.5000 mg | ORAL_TABLET | Freq: Two times a day (BID) | ORAL | 1 refills | Status: DC

## 2018-08-22 MED ORDER — FUROSEMIDE 40 MG PO TABS: 80.0000 mg | ORAL_TABLET | Freq: Two times a day (BID) | ORAL | 2 refills | Status: DC

## 2018-08-22 MED ORDER — POTASSIUM CHLORIDE CRYS ER 20 MEQ PO TBCR: 40.0000 meq | EXTENDED_RELEASE_TABLET | Freq: Every day | ORAL | 1 refills | Status: DC

## 2018-08-22 MED ORDER — SACUBITRIL-VALSARTAN 24-26 MG PO TABS: 1.0000 | ORAL_TABLET | Freq: Two times a day (BID) | ORAL | 1 refills | Status: DC

## 2018-08-22 MED ORDER — AMIODARONE HCL 200 MG PO TABS: 200.0000 mg | ORAL_TABLET | Freq: Every day | ORAL | 2 refills | Status: DC

## 2018-08-22 NOTE — Telephone Encounter (Signed)
°*  STAT* If patient is at the pharmacy, call can be transferred to refill team.   1. Which medications need to be refilled? (please list name of each medication and dose if known). Furosemide (LASIX) 40 MG tablet  carvedilol (COREG) 12.5 MG tablet  amiodarone (PACERONE) 200 MG  sacubitril-valsartan (ENTRESTO) 24-26 MG pravastatin (PRAVACHOL) 40 MG  potassium chloride SA (KLOR-CON M20) 20 MEQ tablet   2. Which pharmacy/location (including street and city if local pharmacy) is medication to be sent to?  [facility] - [place], [place] - [address] [phone] (Phone) [phone] (Fax)    3. Do they need a 30 day or 90 day supply? 90 day

## 2018-08-30 ENCOUNTER — Telehealth: Payer: Self-pay | Admitting: *Deleted

## 2018-08-30 NOTE — Telephone Encounter (Signed)
Austin Bush is too expensive, is there anything else pt can take? Please advise.

## 2018-08-30 NOTE — Telephone Encounter (Signed)
Phoned patient and gave contact number for [facility] patient assistance. Informed to call # and they will help him get set up with patient assistance. Verbalizes understanding, no additional questions or concerns.

## 2018-08-30 NOTE — Telephone Encounter (Signed)
Phoned patient who reports 1 st month of entresto was free, but now its $125/month and he can't afford it. He phoned insurance and says that although all his other prescriptions have no copay since he's on Medicare, this one does.     Told patient I will look into it and call him back.

## 2018-09-07 ENCOUNTER — Telehealth: Payer: Self-pay | Admitting: *Deleted

## 2018-09-07 MED ORDER — NITROGLYCERIN 0.4 MG SL SUBL: 0.4000 mg | SUBLINGUAL_TABLET | SUBLINGUAL | 2 refills | Status: DC | PRN

## 2018-09-07 NOTE — Telephone Encounter (Signed)
Rx refill sent to pharmacy.   *STAT* If patient is at the pharmacy, call can be transferred to refill team.   1. Which medications need to be refilled? (please list name of each medication and dose if known) Nitroglycerine  2. Which pharmacy/location (including street and city if local pharmacy) is medication to be sent to?[facility]  3. Do they need a 30 day or 90 day supply?

## 2018-09-12 ENCOUNTER — Ambulatory Visit (INDEPENDENT_AMBULATORY_CARE_PROVIDER_SITE_OTHER): Payer: Medicare HMO | Admitting: *Deleted

## 2018-09-12 DIAGNOSIS — I472 Ventricular tachycardia, unspecified: Secondary | ICD-10-CM

## 2018-09-12 DIAGNOSIS — I255 Ischemic cardiomyopathy: Secondary | ICD-10-CM | POA: Diagnosis not present

## 2018-09-14 LAB — CUP PACEART REMOTE DEVICE CHECK
Date Time Interrogation Session: 20200625093054
Implantable Lead Implant Date: 20160201
Implantable Lead Implant Date: 20160201
Implantable Lead Location: 753859
Implantable Lead Location: 753860
Implantable Lead Model: 350
Implantable Lead Model: 379
Implantable Lead Serial Number: 29736564
Implantable Lead Serial Number: 49114934
Implantable Pulse Generator Implant Date: 20160201
Pulse Gen Model: 392412
Pulse Gen Serial Number: 60838040

## 2018-09-18 ENCOUNTER — Telehealth (INDEPENDENT_AMBULATORY_CARE_PROVIDER_SITE_OTHER): Payer: Medicare HMO | Admitting: Cardiology

## 2018-09-18 ENCOUNTER — Telehealth: Payer: Self-pay | Admitting: *Deleted

## 2018-09-18 DIAGNOSIS — Z79899 Other long term (current) drug therapy: Secondary | ICD-10-CM | POA: Diagnosis not present

## 2018-09-18 DIAGNOSIS — I480 Paroxysmal atrial fibrillation: Secondary | ICD-10-CM | POA: Diagnosis not present

## 2018-09-18 DIAGNOSIS — I255 Ischemic cardiomyopathy: Secondary | ICD-10-CM

## 2018-09-18 MED ORDER — CARVEDILOL 25 MG PO TABS: 25.0000 mg | ORAL_TABLET | Freq: Two times a day (BID) | ORAL | 2 refills | Status: DC

## 2018-09-18 NOTE — Telephone Encounter (Signed)
Virtual Visit Pre-Appointment Phone Call  "(Name), I am calling you today to discuss your upcoming appointment. We are currently trying to limit exposure to the virus that causes COVID-19 by seeing patients at home rather than in the office."  1. "What is the BEST phone number to call the day of the visit?" - include this in appointment notes  2. "Do you have or have access to (through a family member/friend) a smartphone with video capability that we can use for your visit?" a. If yes - list this number in appt notes as "cell" (if different from BEST phone #) and list the appointment type as a VIDEO visit in appointment notes b. If no - list the appointment type as a PHONE visit in appointment notes  3. Confirm consent - "In the setting of the current Covid19 crisis, you are scheduled for a (phone or video) visit with your provider on (date) at (time).  Just as we do with many in-office visits, in order for you to participate in this visit, we must obtain consent.  If you'd like, I can send this to your mychart (if signed up) or email for you to review.  Otherwise, I can obtain your verbal consent now.  All virtual visits are billed to your insurance company just like a normal visit would be.  By agreeing to a virtual visit, we'd like you to understand that the technology does not allow for your provider to perform an examination, and thus may limit your provider's ability to fully assess your condition. If your provider identifies any concerns that need to be evaluated in person, we will make arrangements to do so.  Finally, though the technology is pretty good, we cannot assure that it will always work on either your or our end, and in the setting of a video visit, we may have to convert it to a phone-only visit.  In either situation, we cannot ensure that we have a secure connection.  Are you willing to proceed?" STAFF: Did the patient verbally acknowledge consent to telehealth visit? Document  YES/NO here: yes  4. Advise patient to be prepared - "Two hours prior to your appointment, go ahead and check your blood pressure, pulse, oxygen saturation, and your weight (if you have the equipment to check those) and write them all down. When your visit starts, your provider will ask you for this information. If you have an Apple Watch or Kardia device, please plan to have heart rate information ready on the day of your appointment. Please have a pen and paper handy nearby the day of the visit as well."  5. Give patient instructions for MyChart download to smartphone OR Doximity/Doxy.me as below if video visit (depending on what platform provider is using)  6. Inform patient they will receive a phone call 15 minutes prior to their appointment time (may be from unknown caller ID) so they should be prepared to answer    TELEPHONE CALL NOTE  Austin Bush has been deemed a candidate for a follow-up tele-health visit to limit community exposure during the Covid-19 pandemic. I spoke with the patient via phone to ensure availability of phone/video source, confirm preferred email & phone number, and discuss instructions and expectations.  I reminded Austin Bush to be prepared with any vital sign and/or heart rhythm information that could potentially be obtained via home monitoring, at the time of his visit. I reminded Austin Bush to expect a phone call prior to his visit.  Austin Bush 09/18/2018 8:17 AM   INSTRUCTIONS FOR DOWNLOADING THE MYCHART APP TO SMARTPHONE  - The patient must first make sure to have activated MyChart and know their login information - If Apple, go to [employer] Store and type in MyChart in the search bar and download the app. If Android, ask patient to go to [employer] Play Store and type in [place]Chart in the search bar and download the app. The app is free but as with any other app downloads, their phone may require them to verify saved payment information or Apple/Android  password.  - The patient will need to then log into the app with their MyChart username and password, and select [place] as their healthcare provider to link the account. When it is time for your visit, go to the MyChart app, find appointments, and click Begin Video Visit. Be sure to Select Allow for your device to access the Microphone and Camera for your visit. You will then be connected, and your provider will be with you shortly.  **If they have any issues connecting, or need assistance please contact MyChart service desk [phone])**  **If using a computer, in order to ensure the best quality for their visit they will need to use either of the following Internet Browsers: [employer] Edge, or Google Chrome**  IF USING DOXIMITY or DOXY.ME - The patient will receive a link just prior to their visit by text.     FULL LENGTH CONSENT FOR TELE-HEALTH VISIT   I hereby voluntarily request, consent and authorize [facility] and its employed or contracted physicians, physician assistants, nurse practitioners or other licensed health care professionals (the Practitioner), to provide me with telemedicine health care services (the "Services") as deemed necessary by the treating Practitioner. I acknowledge and consent to receive the Services by the Practitioner via telemedicine. I understand that the telemedicine visit will involve communicating with the Practitioner through live audiovisual communication technology and the disclosure of certain medical information by electronic transmission. I acknowledge that I have been given the opportunity to request an in-person assessment or other available alternative prior to the telemedicine visit and am voluntarily participating in the telemedicine visit.  I understand that I have the right to withhold or withdraw my consent to the use of telemedicine in the course of my care at any time, without affecting my right to future care or treatment,  and that the Practitioner or I may terminate the telemedicine visit at any time. I understand that I have the right to inspect all information obtained and/or recorded in the course of the telemedicine visit and may receive copies of available information for a reasonable fee.  I understand that some of the potential risks of receiving the Services via telemedicine include:  Marland Kitchen. Delay or interruption in medical evaluation due to technological equipment failure or disruption; . Information transmitted may not be sufficient (e.g. poor resolution of images) to allow for appropriate medical decision making by the Practitioner; and/or  . In rare instances, security protocols could fail, causing a breach of personal health information.  Furthermore, I acknowledge that it is my responsibility to provide information about my medical history, conditions and care that is complete and accurate to the best of my ability. I acknowledge that Practitioner's advice, recommendations, and/or decision may be based on factors not within their control, such as incomplete or inaccurate data provided by me or distortions of diagnostic images or specimens that may result from electronic transmissions. I understand that the practice  of medicine is not an Chief Strategy Officer and that Practitioner makes no warranties or guarantees regarding treatment outcomes. I acknowledge that I will receive a copy of this consent concurrently upon execution via email to the email address I last provided but may also request a printed copy by calling the office of [place].    I understand that my insurance will be billed for this visit.   I have read or had this consent read to me. . I understand the contents of this consent, which adequately explains the benefits and risks of the Services being provided via telemedicine.  . I have been provided ample opportunity to ask questions regarding this consent and the Services and have had my questions  answered to my satisfaction. . I give my informed consent for the services to be provided through the use of telemedicine in my medical care  By participating in this telemedicine visit I agree to the above.

## 2018-09-18 NOTE — Patient Instructions (Addendum)
Medication Instructions:  Your physician has recommended you make the following change in your medication: 1. INCREASE Carvedilol to 25 mg twice a day.  *If you need a refill on your cardiac medications before your next appointment, please call your pharmacy*  Labwork: Stop by the [place] office next week for lab work.  When you get there call the office and they will advise you of when to enter the office.  Lab work is for TSH & LFTs.  Testing/Procedures: None ordered  Follow-Up: Your physician wants you to follow-up in: 6 months with Dr. Curt Bears.  You will receive a reminder letter in the mail two months in advance. If you don't receive a letter, please call our office to schedule the follow-up appointment.  Thank you for choosing [facility]!!   Trinidad Curet, RN [phone]  Any Other Special Instructions Will Be Listed Below (If Applicable).

## 2018-09-18 NOTE — Progress Notes (Signed)
Electrophysiology TeleHealth Note   Due to national recommendations of social distancing due to COVID 19, an audio/video telehealth visit is felt to be most appropriate for this patient at this time.  See Epic message for the patient's consent [facility].   Date:  09/18/2018   ID:  Austin Bush, DOB 11/16/1955, MRN [id number]  Location: patient's home  Provider location: [address], [place]  Evaluation Performed: Follow-up visit  PCP:  Gordan PaymentGrisso, Greg A., MD  [facility]  [facility] Electrophysiologist:  Dr Elberta Fortisamnitz  Chief Complaint:  pacemaker  History of Present Illness:    Austin AuerMichael Bush is a 63 y.o. male who presents via audio/video conferencing for a telehealth visit today.  Since last being seen in our clinic, the patient reports doing very well.  Today, he denies symptoms of palpitations, chest pain, shortness of breath,  lower extremity edema, dizziness, presyncope, or syncope.  The patient is otherwise without complaint today.  The patient denies symptoms of fevers, chills, cough, or new SOB worrisome for COVID 19.  He has a history of coronary artery disease status post PCI with an ischemic cardiomyopathy and ejection of 25% and class II systolic heart failure, COPD, diabetes, hypertension, hyperlipidemia.  He had cardiac arrest in 2009 now has a Biotronik dual-chamber ICD.  He has had 2-lead revisions.  Today, denies symptoms of palpitations, chest pain, shortness of breath, orthopnea, PND, lower extremity edema, claudication, dizziness, presyncope, syncope, bleeding, or neurologic sequela. The patient is tolerating medications without difficulties.  Overall he is feeling well.  He is able to do all his daily activities.  He does say that he has noted that when he exerts himself such as walking up and down stairs with his heart rate gets into the 1 teens.  He says this is higher than it is been in the past.  Past Medical History:  Diagnosis  Date  . AICD present, double chamber 04/23/2015  . Anemia of unknown etiology 09/17/2016  . BPH (benign prostatic hyperplasia) 04/23/2015  . CAD (coronary artery disease) 04/16/2014   PCI to the LAD   . Cardiac arrhythmia 04/23/2015  . Cardiomyopathy (HCC) 12/12/2014  . Cardiomyopathy, ischemic 04/16/2014   EF 25%   . Cervical disc disease 04/23/2015  . Chronic systolic CHF (congestive heart failure), NYHA class 2 (HCC) 09/09/2016  . COPD (chronic obstructive pulmonary disease) (HCC) 04/23/2015  . Coronary artery disease involving native coronary artery of native heart with angina pectoris (HCC) 04/16/2014   PCI to the LAD  Overview:  a. Cardiac cath 02/03/2012 Severe progression of CAD, albeit with patent stents in the proximal mid LAD and mid to distal circumflex with prior intervention in 2009. Now featuring high-grade ulcerative stenosis at the junction of the proximal and mid RCA, high-grade stenosis at the origin of posterior descending branch involving the very distal portion of the RCA proximal to the bifurcation. 90% stenosis in the mid circumflex above the previously placed stent and 98% stenosis and a large very high takeoff diagonal branch of LAD coronary artery. Very distal LAD apical disease. b. reported catheterization 06/2015 without disease requiring intervention  Overview:  inferoposterior MI June 2009; 12 Sep 2009 with [facility] hospital [facility]  Cardiac cath 07/28/15: Conclusions Diagnostic Procedure Summary Mild stenosis of the Circumflex artery Otherwise no obstructive coronary artery disease. Segmental LV dysfunction of inferior wall -moderate hypokinesis   . Diabetes, polyneuropathy (HCC) 04/24/2015  . Dual ICD (implantable  cardioverter-defibrillator) in place 04/16/2014   Biotronik - history of inappropriate shocks, at least 2 lead replacements  . Dyslipidemia 04/16/2014  . Encounter for long-term (current) use of high-risk medication 04/23/2015  . Encounter for screening  for malignant neoplasm 04/23/2015  . Essential hypertension 04/16/2014  . Fatty liver 04/23/2015  . Gastro-esophageal reflux 04/23/2015  . High risk medication use 09/15/2016  . History of sudden cardiac arrest 04/16/2014   08/2007   . Implanted defibrillator electrode lead fracture 04/23/2015  . Insomnia 04/23/2015  . Ischemic cardiomyopathy 04/16/2014   EF 25%  Overview:  a. EF of 30% after myocardial infarction and cardiac arrest in 2009.  b. S/P dual-chamber ICD in 2009.  c. S/P ICD replacement and ICD generator change in 2012 due to lead malfunction resulting and 32 ICD shocks.  d. Echo 8/13.2013 TDS. LV chamber size is normal. Global LV wall motion and contractility are within normal limits. Estimated EF 55-60%. RV is mildly dilated. RV global systolic function is mildly reduced. Trace MR. Trace TR.  e. S/P system explant from the left with reimplantation on the right side February 1st 2016.  f. Echo 10/31/2014 LA 4.42cm. EF 25-30%. LV chamber size is mildly dilated. E:A reversal in the MV flow pattern suggestive of diastolic dysfunction. LA is mild to moderately dilated. RV is mildly dilated. RV global systolic function is mildly reduced. Mild AR, MR, TR, trace PR. Trivial pericardial effusion is visualized IVC is dilated.  . Lung nodule 04/23/2015  . Mixed hyperlipidemia 02/27/2016   Overview:  a. Statin therapy.   . Nocturnal leg cramps 09/15/2016  . Obesity (BMI 30.0-34.9) 04/16/2014  . Obstructive sleep apnea syndrome 04/16/2014  . Old MI (myocardial infarction) 09/09/2016  . OSA on CPAP 04/16/2014  . Osteoarthritis 04/23/2015  . PVC (premature ventricular contraction) 02/27/2016  . S/P implantation of automatic cardioverter/defibrillator (AICD) 04/16/2014   Biotronik - history of inappropriate shocks, at least 2 lead replacements   . Vitamin D deficiency 04/23/2015    Past Surgical History:  Procedure Laterality Date  . CARDIAC CATHETERIZATION    . INSERT / REPLACE / REMOVE PACEMAKER     Biotronik AICD   . KNEE SURGERY    . UMBILICAL HERNIA REPAIR      Current Outpatient Medications  Medication Sig Dispense Refill  . amiodarone (PACERONE) 200 MG tablet Take 1 tablet (200 mg total) by mouth daily. 90 tablet 2  . aspirin EC 81 MG tablet Take 1 tablet (81 mg total) by mouth daily.    . carvedilol (COREG) 12.5 MG tablet Take 1 tablet (12.5 mg total) by mouth 2 (two) times daily. 180 tablet 1  . Cholecalciferol (D 1000) 1000 units capsule Take 1,000 Units by mouth daily.     . furosemide (LASIX) 40 MG tablet Take 2 tablets (80 mg total) by mouth 2 (two) times daily. Take an extra tablet in the morning if you weigh 206 or greater 270 tablet 2  . gabapentin (NEURONTIN) 400 MG capsule Take 800 mg by mouth 3 (three) times daily.     . metFORMIN (GLUCOPHAGE) 500 MG tablet Take 500 mg by mouth 2 (two) times daily with a meal.     . methocarbamol (ROBAXIN) 500 MG tablet Take 1,000 mg by mouth 2 (two) times daily.    . Multiple Vitamin (MULTIVITAMIN) tablet Take 1 tablet by mouth daily.    . nitroGLYCERIN (NITROSTAT) 0.4 MG SL tablet Place 1 tablet (0.4 mg total) under the tongue every 5 (five) minutes  as needed for chest pain. 25 tablet 2  . Omega-3 Fatty Acids (FISH OIL) 1200 MG CPDR Take 1,200 mg by mouth 3 (three) times daily.    Marland Kitchen. omeprazole (PRILOSEC) 20 MG capsule Take 20 mg by mouth 2 (two) times daily.    . potassium chloride SA (KLOR-CON M20) 20 MEQ tablet Take 2 tablets (40 mEq total) by mouth daily. 180 tablet 1  . pravastatin (PRAVACHOL) 40 MG tablet Take 1 tablet (40 mg total) by mouth daily. 90 tablet 1  . sacubitril-valsartan (ENTRESTO) 24-26 MG Take 1 tablet by mouth 2 (two) times daily. 180 tablet 1   No current facility-administered medications for this visit.     Allergies:   Patient has no known allergies.   Social History:  The patient  reports that he has quit smoking. He quit after 30.00 years of use. He has never used smokeless tobacco. He reports current alcohol use of about  1.0 standard drinks of alcohol per week. He reports that he does not use drugs.   Family History:  The patient's  family history includes Heart attack in his maternal grandfather; Hypertension in his father; Lung cancer in his father.   ROS:  Please see the history of present illness.   All other systems are personally reviewed and negative.    Exam:    Vital Signs:  There were no vitals taken for this visit.  Well appearing, alert and conversant, regular work of breathing,  good skin color Eyes- anicteric, neuro- grossly intact, skin- no apparent rash or lesions or cyanosis, mouth- oral mucosa is pink   Labs/Other Tests and Data Reviewed:    Recent Labs: 10/07/2017: ALT 22; BUN 14; Creatinine, Ser 1.03; Potassium 4.2; Sodium 140; TSH 2.160   Wt Readings from Last 3 Encounters:  05/16/18 228 lb 1.9 oz (103.5 kg)  01/02/18 219 lb (99.3 kg)  10/07/17 216 lb (98 kg)     Other studies personally reviewed: Additional studies/ records that were reviewed today include: ECG 01/02/2018 personally reviewed Review of the above records today demonstrates: Sinus rhythm, inferior infarct  Last device remote is reviewed from [employer] PDF dated 09/14/18 which reveals normal device function, no arrhythmias    ASSESSMENT & PLAN:    1.  Chronic systolic heart failure due to ischemic cardiomyopathy: Status post Biotronik dual-chamber ICD.  Currently on full medical therapy.  No signs of volume overload.  His heart rates have been fast.  Device interrogation shows no rhythm abnormalities.  It seems that he can tolerate higher doses of carvedilol and thus we Austin Bush increase him to 25 mg twice a day.  He also has not had labs checked for amiodarone surveillance and thus we Austin Bush check a hepatic panel and TSH.  2.  PVCs: Minimally symptomatic  3.  Hypertension: Has not been recently checked.  4.  Hyperlipidemia: Continue pravastatin per primary cardiology  5.  Ventricular tachycardia: Found on device.   Is currently on amiodarone.   COVID 19 screen The patient denies symptoms of COVID 19 at this time.  The importance of social distancing was discussed today.  Follow-up: 6 months  Current medicines are reviewed at length with the patient today.   The patient does not have concerns regarding his medicines.  The following changes were made today:  none  Labs/ tests ordered today include:  No orders of the defined types were placed in this encounter.    Patient Risk:  after full review of this patients clinical status,  I feel that they are at moderate risk at this time.  Today, I have spent 8 minutes with the patient with telehealth technology discussing systolic heart failure.    Signed, Garv Kuechle Meredith Leeds, MD  09/18/2018 9:07 AM     [facility] [address] [place] [address] [phone] (office) [phone] (fax)

## 2018-09-18 NOTE — Addendum Note (Signed)
Addended by: Stanton Kidney on: 09/18/2018 09:46 AM   Modules accepted: Orders

## 2018-09-25 NOTE — Progress Notes (Signed)
Remote ICD transmission.   

## 2018-11-07 ENCOUNTER — Ambulatory Visit: Payer: Medicare HMO | Admitting: Cardiology

## 2018-11-15 ENCOUNTER — Ambulatory Visit: Payer: Medicare HMO | Admitting: Cardiology

## 2018-12-13 ENCOUNTER — Ambulatory Visit (INDEPENDENT_AMBULATORY_CARE_PROVIDER_SITE_OTHER): Payer: Medicare HMO | Admitting: *Deleted

## 2018-12-13 DIAGNOSIS — I255 Ischemic cardiomyopathy: Secondary | ICD-10-CM | POA: Diagnosis not present

## 2018-12-13 LAB — CUP PACEART REMOTE DEVICE CHECK
Date Time Interrogation Session: 20200923111422
Implantable Lead Implant Date: 20160201
Implantable Lead Implant Date: 20160201
Implantable Lead Location: 753859
Implantable Lead Location: 753860
Implantable Lead Model: 350
Implantable Lead Model: 379
Implantable Lead Serial Number: 29736564
Implantable Lead Serial Number: 49114934
Implantable Pulse Generator Implant Date: 20160201
Pulse Gen Model: 392412
Pulse Gen Serial Number: 60838040

## 2018-12-19 ENCOUNTER — Encounter: Payer: Self-pay | Admitting: Cardiology

## 2018-12-19 NOTE — Progress Notes (Signed)
Remote ICD transmission.   

## 2019-01-14 NOTE — Progress Notes (Signed)
Cardiology Office Note:    Date:  01/17/2019   ID:  Austin Bush, DOB January 03, 1956, MRN [id number]  PCP:  Gordan Payment., MD  Cardiologist:  Norman Herrlich, MD    Referring MD: Gordan Payment., MD    ASSESSMENT:    1. Coronary artery disease involving native coronary artery of native heart with angina pectoris (HCC)   2. Chronic combined systolic and diastolic heart failure (HCC)   3. Hypertensive heart disease with heart failure (HCC)   4. VT (ventricular tachycardia) (HCC)   5. AICD present, double chamber   6. On amiodarone therapy   7. Hyperlipidemia, unspecified hyperlipidemia type    PLAN:    In order of problems listed above:  1. Stable CAD having no angina on current treatment [place] Heart Association class I continue current therapy with low-dose aspirin beta-blocker and lipid-lowering therapy with pravastatin as he is intolerant of high intensity statins. 2. Stable compensated no fluid overload continue his loop diuretic and guideline directed treatment beta-blocker and Entresto. 3. Stable hypertension continue guideline directed therapy 4. Stable arrhythmia VT stable ICD function continue low-dose amiodarone no evidence of toxicity but check chest x-ray and if he had fibrotic changes would discontinue 5. Able continue his current statin as he is poorly tolerant of high intensity   Next appointment: 6 months   Medication Adjustments/Labs and Tests Ordered: Current medicines are reviewed at length with the patient today.  Concerns regarding medicines are outlined above.  No orders of the defined types were placed in this encounter.  No orders of the defined types were placed in this encounter.   Chief Complaint  Patient presents with  . Follow-up  . Coronary Artery Disease  . Cardiomyopathy  . Hypertension  . Hyperlipidemia    History of Present Illness:    Austin Bush is a 63 y.o. male with a hx of SCD/VF, ICD, PVC's, CAD, CHF with most recent EF  45%, Dyslipidemia, HTN, S/P PCI seen 08/26/17 and initiated on amiodarone for VT recurrence  He was last seen 05/16/2018. Compliance with diet, lifestyle and medications: Yes  I reviewed the ICD check with the patient and wife Recent labs from his primary care physician's office September cholesterol 196 LDL 119 HDL 68 CMP including liver function normal potassium 4.0 TSH normal 2.40  He has had no edema chest pain palpitation or syncope but is gained about 10 pounds during Covid due to changes in eating habits.  Is had no device therapy delivered.  ICD check 12/13/2018: Conclusion  Scheduled remote reviewed. Normal device function.    Next remote 91 days.   Result Report  Clinic Name: [facility]      Date Time Interrogation Session: [id number]       Implantable Lead Implant Date: [id number]      Implantable Lead Implant Date: [id number]       Implantable Lead Location: [id number]      Implantable Lead Location: [id number]       Implantable Lead Location Detail 1: UNKNOWN      Implantable Lead Location Detail 1: UNKNOWN       Implantable Lead Manufacturer: BITR      Implantable Lead Manufacturer: BITR       Implantable Lead Model: [id number] Setrox S S 53      Implantable Lead Model: 379 [id number] Protego S 65       Implantable Lead Serial Number: [id number]      Implantable Lead Serial Number: [id number]  Implantable Pulse Generator Implant Date: [id number]      Implantable Pulse Generator Type: Implantable Cardiac Defibulator       Pulse Gen Model: [id number] Itrevia 7 DR-T      Pulse Gen Serial Number: [id number]       Pulse Generator Manufacturer: BITR         CXR 11/11/2017: FINDINGS: Right AICD remains in place, unchanged. Mild hyperinflation of the lungs. Heart is normal size. No confluent airspace opacities or effusions. No acute bony abnormality. Biapical scarring. IMPRESSION: Mild hyperinflation and chronic changes, stable.  No active disease.  Past  Medical History:  Diagnosis Date  . AICD present, double chamber 04/23/2015  . Anemia of unknown etiology 09/17/2016  . BPH (benign prostatic hyperplasia) 04/23/2015  . CAD (coronary artery disease) 04/16/2014   PCI to the LAD   . Cardiac arrhythmia 04/23/2015  . Cardiomyopathy (HCC) 12/12/2014  . Cardiomyopathy, ischemic 04/16/2014   EF 25%   . Cervical disc disease 04/23/2015  . Chronic systolic CHF (congestive heart failure), NYHA class 2 (HCC) 09/09/2016  . COPD (chronic obstructive pulmonary disease) (HCC) 04/23/2015  . Coronary artery disease involving native coronary artery of native heart with angina pectoris (HCC) 04/16/2014   PCI to the LAD  Overview:  a. Cardiac cath 02/03/2012 Severe progression of CAD, albeit with patent stents in the proximal mid LAD and mid to distal circumflex with prior intervention in 2009. Now featuring high-grade ulcerative stenosis at the junction of the proximal and mid RCA, high-grade stenosis at the origin of posterior descending branch involving the very distal portion of the RCA proximal to the bifurcation. 90% stenosis in the mid circumflex above the previously placed stent and 98% stenosis and a large very high takeoff diagonal branch of LAD coronary artery. Very distal LAD apical disease. b. reported catheterization 06/2015 without disease requiring intervention  Overview:  inferoposterior MI June 2009; 12 Sep 2009 with out of hospital SCD, hypothermia and [facility]  Cardiac cath 07/28/15: Conclusions Diagnostic Procedure Summary Mild stenosis of the Circumflex artery Otherwise no obstructive coronary artery disease. Segmental LV dysfunction of inferior wall -moderate hypokinesis   . Diabetes, polyneuropathy (HCC) 04/24/2015  . 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 Dyslipidemia 04/16/2014  . Encounter for long-term (current) use of high-risk medication 04/23/2015   . Encounter for screening for malignant neoplasm 04/23/2015  . Essential hypertension 04/16/2014  . Fatty liver 04/23/2015  . Gastro-esophageal reflux 04/23/2015  . High risk medication use 09/15/2016  . History of sudden cardiac arrest 04/16/2014   08/2007   . Implanted defibrillator electrode lead fracture 04/23/2015  . Insomnia 04/23/2015  . Ischemic cardiomyopathy 04/16/2014   EF 25%  Overview:  a. EF of 30% after myocardial infarction and cardiac arrest in 2009.  b. S/P dual-chamber ICD in 2009.  c. S/P ICD replacement and ICD generator change in 2012 due to lead malfunction resulting and 32 ICD shocks.  d. Echo 8/13.2013 TDS. LV chamber size is normal. Global LV wall motion and contractility are within normal limits. Estimated EF 55-60%. RV is mildly dilated. RV global systolic function is mildly reduced. Trace MR. Trace TR.  e. S/P system explant from the left with reimplantation on the right side February 1st 2016.  f. Echo 10/31/2014 LA 4.42cm. EF 25-30%. LV chamber size is mildly dilated. E:A reversal in the MV flow pattern suggestive of diastolic dysfunction. LA is mild  to moderately dilated. RV is mildly dilated. RV global systolic function is mildly reduced. Mild AR, MR, TR, trace PR. Trivial pericardial effusion is visualized IVC is dilated.  . Lung nodule 04/23/2015  . Mixed hyperlipidemia 02/27/2016   Overview:  a. Statin therapy.   . Nocturnal leg cramps 09/15/2016  . Obesity (BMI 30.0-34.9) 04/16/2014  . Obstructive sleep apnea syndrome 04/16/2014  . Old MI (myocardial infarction) 09/09/2016  . OSA on CPAP 04/16/2014  . Osteoarthritis 04/23/2015  . PVC (premature ventricular contraction) 02/27/2016  . S/P implantation of automatic cardioverter/defibrillator (AICD) 04/16/2014   Biotronik - history of inappropriate shocks, at least 2 lead replacements   . Vitamin D deficiency 04/23/2015    Past Surgical History:  Procedure Laterality Date  . CARDIAC CATHETERIZATION    . INSERT / REPLACE / REMOVE  PACEMAKER     Biotronik AICD  . KNEE SURGERY    . UMBILICAL HERNIA REPAIR      Current Medications: Current Meds  Medication Sig  . amiodarone (PACERONE) 200 MG tablet Take 1 tablet (200 mg total) by mouth daily.  Marland Kitchen aspirin EC 81 MG tablet Take 1 tablet (81 mg total) by mouth daily.  . carvedilol (COREG) 25 MG tablet Take 1 tablet (25 mg total) by mouth 2 (two) times daily.  . Cholecalciferol (D 1000) 1000 units capsule Take 1,000 Units by mouth daily.   . furosemide (LASIX) 40 MG tablet Take 2 tablets (80 mg total) by mouth 2 (two) times daily. Take an extra tablet in the morning if you weigh 206 or greater  . gabapentin (NEURONTIN) 400 MG capsule Take 800 mg by mouth 3 (three) times daily.   . metFORMIN (GLUCOPHAGE) 500 MG tablet Take 500 mg by mouth 2 (two) times daily with a meal.   . methocarbamol (ROBAXIN) 500 MG tablet Take 1,000 mg by mouth 2 (two) times daily.  . Multiple Vitamin (MULTIVITAMIN) tablet Take 1 tablet by mouth daily.  . nitroGLYCERIN (NITROSTAT) 0.4 MG SL tablet Place 1 tablet (0.4 mg total) under the tongue every 5 (five) minutes as needed for chest pain.  . Omega-3 Fatty Acids (FISH OIL) 1200 MG CPDR Take 1,200 mg by mouth 3 (three) times daily.  Marland Kitchen omeprazole (PRILOSEC) 20 MG capsule Take 20 mg by mouth 2 (two) times daily.  . potassium chloride SA (KLOR-CON M20) 20 MEQ tablet Take 2 tablets (40 mEq total) by mouth daily.  . pravastatin (PRAVACHOL) 40 MG tablet Take 1 tablet (40 mg total) by mouth daily.  . sacubitril-valsartan (ENTRESTO) 24-26 MG Take 1 tablet by mouth 2 (two) times daily.     Allergies:   Patient has no known allergies.   Social History   Socioeconomic History  . Marital status: Married    Spouse name: Not on file  . Number of children: Not on file  . Years of education: Not on file  . Highest education level: Not on file  Occupational History  . Not on file  Social Needs  . Financial resource strain: Not on file  . Food insecurity     Worry: Not on file    Inability: Not on file  . Transportation needs    Medical: Not on file    Non-medical: Not on file  Tobacco Use  . Smoking status: Former Smoker    Years: 30.00  . Smokeless tobacco: Never Used  Substance and Sexual Activity  . Alcohol use: Yes    Alcohol/week: 1.0 standard drinks    Types:  1 Glasses of wine per week  . Drug use: No  . Sexual activity: Not on file  Lifestyle  . Physical activity    Days per week: Not on file    Minutes per session: Not on file  . Stress: Not on file  Relationships  . Social Herbalist on phone: Not on file    Gets together: Not on file    Attends religious service: Not on file    Active member of club or organization: Not on file    Attends meetings of clubs or organizations: Not on file    Relationship status: Not on file  Other Topics Concern  . Not on file  Social History Narrative  . Not on file     Family History: The patient's family history includes Heart attack in his maternal grandfather; Hypertension in his father; Lung cancer in his father. ROS:   Please see the history of present illness.    All other systems reviewed and are negative.  EKGs/Labs/Other Studies Reviewed:    The following studies were reviewed today:  EKG:  EKG ordered today and personally reviewed.  The ekg ordered today demonstrates sinus rhythm 2 PVCs otherwise normal  Recent Labs: No results found for requested labs within last 8760 hours.  Recent Lipid Panel    Component Value Date/Time   CHOL 182 10/07/2017 0911   TRIG 81 10/07/2017 0911   HDL 64 10/07/2017 0911   CHOLHDL 2.8 10/07/2017 0911   LDLCALC 102 (H) 10/07/2017 0911    Physical Exam:    VS:  BP 120/64 (BP Location: Right Arm, Patient Position: Sitting, Cuff Size: Normal)   Pulse 82   Ht 6' (1.829 m)   Wt 236 lb 3.2 oz (107.1 kg)   SpO2 97%   BMI 32.03 kg/m     Wt Readings from Last 3 Encounters:  01/17/19 236 lb 3.2 oz (107.1 kg)   05/16/18 228 lb 1.9 oz (103.5 kg)  01/02/18 219 lb (99.3 kg)     GEN:  Well nourished, well developed in no acute distress he has ecchymoses on his arm from work with bruising HEENT: Normal NECK: No JVD; No carotid bruits LYMPHATICS: No lymphadenopathy CARDIAC: RRR, no murmurs, rubs, gallops RESPIRATORY:  Clear to auscultation without rales, wheezing or rhonchi  ABDOMEN: Soft, non-tender, non-distended MUSCULOSKELETAL:  No edema; No deformity  SKIN: Warm and dry NEUROLOGIC:  Alert and oriented x 3 PSYCHIATRIC:  Normal affect    Signed, Shirlee More, MD  01/17/2019 10:06 AM    [place]

## 2019-01-17 ENCOUNTER — Encounter: Payer: Self-pay | Admitting: Cardiology

## 2019-01-17 ENCOUNTER — Other Ambulatory Visit: Payer: Self-pay

## 2019-01-17 ENCOUNTER — Ambulatory Visit (INDEPENDENT_AMBULATORY_CARE_PROVIDER_SITE_OTHER): Payer: Medicare HMO | Admitting: Cardiology

## 2019-01-17 VITALS — BP 120/64 | HR 82 | Ht 72.0 in | Wt 236.2 lb

## 2019-01-17 DIAGNOSIS — I5042 Chronic combined systolic (congestive) and diastolic (congestive) heart failure: Secondary | ICD-10-CM | POA: Diagnosis not present

## 2019-01-17 DIAGNOSIS — E785 Hyperlipidemia, unspecified: Secondary | ICD-10-CM

## 2019-01-17 DIAGNOSIS — I472 Ventricular tachycardia, unspecified: Secondary | ICD-10-CM

## 2019-01-17 DIAGNOSIS — I25119 Atherosclerotic heart disease of native coronary artery with unspecified angina pectoris: Secondary | ICD-10-CM

## 2019-01-17 DIAGNOSIS — Z95810 Presence of automatic (implantable) cardiac defibrillator: Secondary | ICD-10-CM

## 2019-01-17 DIAGNOSIS — I110 Hypertensive heart disease with heart failure: Secondary | ICD-10-CM

## 2019-01-17 DIAGNOSIS — Z79899 Other long term (current) drug therapy: Secondary | ICD-10-CM

## 2019-01-17 NOTE — Patient Instructions (Signed)
Medication Instructions:  Your physician recommends that you continue on your current medications as directed. Please refer to the Current Medication list given to you today.  *If you need a refill on your cardiac medications before your next appointment, please call your pharmacy*  Lab Work: None  If you have labs (blood work) drawn today and your tests are completely normal, you will receive your results only by: Marland Kitchen MyChart Message (if you have MyChart) OR . A paper copy in the mail If you have any lab test that is abnormal or we need to change your treatment, we will call you to review the results.  Testing/Procedures: A chest x-ray takes a picture of the organs and structures inside the chest, including the heart, lungs, and blood vessels. This test can show several things, including, whether the heart is enlarges; whether fluid is building up in the lungs; and whether pacemaker / defibrillator leads are still in place.  Follow-Up: At [facility], you and your health needs are our priority.  As part of our continuing mission to provide you with exceptional heart care, we have created designated Provider Care Teams.  These Care Teams include your primary Cardiologist (physician) and Advanced Practice Providers (APPs -  Physician Assistants and Nurse Practitioners) who all work together to provide you with the care you need, when you need it.  Your next appointment:   6 months  The format for your next appointment:   In Person  Provider:   Shirlee More, MD

## 2019-01-17 NOTE — Addendum Note (Signed)
Addended by: Austin Miles on: 01/17/2019 10:23 AM   Modules accepted: Orders

## 2019-01-25 ENCOUNTER — Telehealth: Payer: Self-pay | Admitting: *Deleted

## 2019-01-25 NOTE — Telephone Encounter (Signed)
Left message on patient's cell phone per DPR advising of normal chest x-ray results. Informed patient to contact our office with any further questions or concerns.

## 2019-02-06 DIAGNOSIS — G8929 Other chronic pain: Secondary | ICD-10-CM | POA: Insufficient documentation

## 2019-03-13 ENCOUNTER — Telehealth: Payer: Self-pay | Admitting: Cardiology

## 2019-03-13 NOTE — Telephone Encounter (Signed)
*  STAT* If patient is at the pharmacy, call can be transferred to refill team.   1. Which medications need to be refilled? (please list name of each medication and dose if known) potassium chloride 23meq  2. Which pharmacy/location (including street and city if local pharmacy) is medication to be sent [facility] [place] [facility]  3. Do they need a 30 day or 90 day supply? [place]

## 2019-03-14 ENCOUNTER — Other Ambulatory Visit: Payer: Self-pay | Admitting: *Deleted

## 2019-03-14 ENCOUNTER — Ambulatory Visit (INDEPENDENT_AMBULATORY_CARE_PROVIDER_SITE_OTHER): Payer: Medicare HMO | Admitting: *Deleted

## 2019-03-14 DIAGNOSIS — Z95810 Presence of automatic (implantable) cardiac defibrillator: Secondary | ICD-10-CM | POA: Diagnosis not present

## 2019-03-14 MED ORDER — POTASSIUM CHLORIDE CRYS ER 20 MEQ PO TBCR: 40.0000 meq | EXTENDED_RELEASE_TABLET | Freq: Every day | ORAL | 1 refills | Status: DC

## 2019-03-14 NOTE — Telephone Encounter (Signed)
Refill sent.

## 2019-03-15 LAB — CUP PACEART REMOTE DEVICE CHECK
Date Time Interrogation Session: 20201223191232
Implantable Lead Implant Date: 20160201
Implantable Lead Implant Date: 20160201
Implantable Lead Location: 753859
Implantable Lead Location: 753860
Implantable Lead Model: 350
Implantable Lead Model: 379
Implantable Lead Serial Number: 29736564
Implantable Lead Serial Number: 49114934
Implantable Pulse Generator Implant Date: 20160201
Pulse Gen Model: 392412
Pulse Gen Serial Number: 60838040

## 2019-03-16 DIAGNOSIS — K56609 Unspecified intestinal obstruction, unspecified as to partial versus complete obstruction: Secondary | ICD-10-CM

## 2019-03-17 DIAGNOSIS — K56609 Unspecified intestinal obstruction, unspecified as to partial versus complete obstruction: Secondary | ICD-10-CM | POA: Diagnosis not present

## 2019-03-18 DIAGNOSIS — I361 Nonrheumatic tricuspid (valve) insufficiency: Secondary | ICD-10-CM

## 2019-03-18 DIAGNOSIS — K56609 Unspecified intestinal obstruction, unspecified as to partial versus complete obstruction: Secondary | ICD-10-CM | POA: Diagnosis not present

## 2019-03-19 DIAGNOSIS — K56609 Unspecified intestinal obstruction, unspecified as to partial versus complete obstruction: Secondary | ICD-10-CM | POA: Diagnosis not present

## 2019-03-26 DIAGNOSIS — Z8719 Personal history of other diseases of the digestive system: Secondary | ICD-10-CM | POA: Insufficient documentation

## 2019-04-22 ENCOUNTER — Other Ambulatory Visit: Payer: Self-pay | Admitting: Cardiology

## 2019-06-07 DIAGNOSIS — Z862 Personal history of diseases of the blood and blood-forming organs and certain disorders involving the immune mechanism: Secondary | ICD-10-CM

## 2019-06-07 HISTORY — DX: Personal history of diseases of the blood and blood-forming organs and certain disorders involving the immune mechanism: Z86.2

## 2019-06-13 ENCOUNTER — Ambulatory Visit (INDEPENDENT_AMBULATORY_CARE_PROVIDER_SITE_OTHER): Payer: Medicare HMO | Admitting: *Deleted

## 2019-06-13 DIAGNOSIS — Z95810 Presence of automatic (implantable) cardiac defibrillator: Secondary | ICD-10-CM | POA: Diagnosis not present

## 2019-06-13 LAB — CUP PACEART REMOTE DEVICE CHECK
Date Time Interrogation Session: 20210324111138
Implantable Lead Implant Date: 20160201
Implantable Lead Implant Date: 20160201
Implantable Lead Location: 753859
Implantable Lead Location: 753860
Implantable Lead Model: 350
Implantable Lead Model: 379
Implantable Lead Serial Number: 29736564
Implantable Lead Serial Number: 49114934
Implantable Pulse Generator Implant Date: 20160201
Pulse Gen Model: 392412
Pulse Gen Serial Number: 60838040

## 2019-06-14 NOTE — Progress Notes (Signed)
ICD Remote  

## 2019-07-13 ENCOUNTER — Other Ambulatory Visit: Payer: Self-pay

## 2019-07-13 DIAGNOSIS — Z23 Encounter for immunization: Secondary | ICD-10-CM | POA: Insufficient documentation

## 2019-07-13 DIAGNOSIS — M7710 Lateral epicondylitis, unspecified elbow: Secondary | ICD-10-CM

## 2019-07-13 DIAGNOSIS — I5020 Unspecified systolic (congestive) heart failure: Secondary | ICD-10-CM

## 2019-07-13 DIAGNOSIS — M170 Bilateral primary osteoarthritis of knee: Secondary | ICD-10-CM

## 2019-07-13 DIAGNOSIS — I519 Heart disease, unspecified: Secondary | ICD-10-CM

## 2019-07-13 DIAGNOSIS — K760 Fatty (change of) liver, not elsewhere classified: Secondary | ICD-10-CM

## 2019-07-13 DIAGNOSIS — G4733 Obstructive sleep apnea (adult) (pediatric): Secondary | ICD-10-CM | POA: Insufficient documentation

## 2019-07-13 DIAGNOSIS — Z9889 Other specified postprocedural states: Secondary | ICD-10-CM | POA: Insufficient documentation

## 2019-07-13 DIAGNOSIS — E119 Type 2 diabetes mellitus without complications: Secondary | ICD-10-CM | POA: Insufficient documentation

## 2019-07-13 DIAGNOSIS — Z462 Encounter for fitting and adjustment of other devices related to nervous system and special senses: Secondary | ICD-10-CM | POA: Insufficient documentation

## 2019-07-13 DIAGNOSIS — K2100 Gastro-esophageal reflux disease with esophagitis, without bleeding: Secondary | ICD-10-CM

## 2019-07-13 DIAGNOSIS — H903 Sensorineural hearing loss, bilateral: Secondary | ICD-10-CM | POA: Insufficient documentation

## 2019-07-13 DIAGNOSIS — F419 Anxiety disorder, unspecified: Secondary | ICD-10-CM | POA: Insufficient documentation

## 2019-07-13 DIAGNOSIS — R911 Solitary pulmonary nodule: Secondary | ICD-10-CM | POA: Insufficient documentation

## 2019-07-13 DIAGNOSIS — Z9989 Dependence on other enabling machines and devices: Secondary | ICD-10-CM | POA: Insufficient documentation

## 2019-07-13 DIAGNOSIS — S46811A Strain of other muscles, fascia and tendons at shoulder and upper arm level, right arm, initial encounter: Secondary | ICD-10-CM | POA: Insufficient documentation

## 2019-07-13 DIAGNOSIS — H524 Presbyopia: Secondary | ICD-10-CM | POA: Insufficient documentation

## 2019-07-13 DIAGNOSIS — Z7189 Other specified counseling: Secondary | ICD-10-CM | POA: Insufficient documentation

## 2019-07-13 DIAGNOSIS — E669 Obesity, unspecified: Secondary | ICD-10-CM | POA: Insufficient documentation

## 2019-07-13 DIAGNOSIS — E1140 Type 2 diabetes mellitus with diabetic neuropathy, unspecified: Secondary | ICD-10-CM

## 2019-07-13 HISTORY — DX: Other specified postprocedural states: Z98.890

## 2019-07-13 HISTORY — DX: Dependence on other enabling machines and devices: Z99.89

## 2019-07-13 HISTORY — DX: Heart disease, unspecified: I51.9

## 2019-07-13 HISTORY — DX: Bilateral primary osteoarthritis of knee: M17.0

## 2019-07-13 HISTORY — DX: Type 2 diabetes mellitus without complications: E11.9

## 2019-07-13 HISTORY — DX: Strain of other muscles, fascia and tendons at shoulder and upper arm level, right arm, initial encounter: S46.811A

## 2019-07-13 HISTORY — DX: Type 2 diabetes mellitus with diabetic neuropathy, unspecified: E11.40

## 2019-07-13 HISTORY — DX: Obstructive sleep apnea (adult) (pediatric): G47.33

## 2019-07-13 HISTORY — DX: Fatty (change of) liver, not elsewhere classified: K76.0

## 2019-07-13 HISTORY — DX: Unspecified systolic (congestive) heart failure: I50.20

## 2019-07-13 HISTORY — DX: Sensorineural hearing loss, bilateral: H90.3

## 2019-07-13 HISTORY — DX: Gastro-esophageal reflux disease with esophagitis, without bleeding: K21.00

## 2019-07-13 HISTORY — DX: Lateral epicondylitis, unspecified elbow: M77.10

## 2019-07-14 NOTE — Progress Notes (Signed)
Cardiology Office Note:    Date:  07/16/2019   ID:  Austin Bush, DOB [birth year], MRN [id number]  PCP:  Gordan Payment., MD  Cardiologist:  Norman Herrlich, MD    Referring MD: Gordan Payment., MD    ASSESSMENT:    1. Chronic combined systolic and diastolic heart failure (HCC)   2. Coronary artery disease involving native coronary artery of native heart with angina pectoris (HCC)   3. Hypertensive heart disease with heart failure (HCC)   4. VT (ventricular tachycardia) (HCC)   5. On amiodarone therapy   6. AICD present, double chamber   7. Hyperlipidemia, unspecified hyperlipidemia type    PLAN:    In order of problems listed above:  1. Appears compensated he has no fluid overload but with his increasing shortness of breath he will have an echocardiogram and continue treatment including beta-blocker and Entresto. 2. Stable CAD no angina on current medical treatment continue same 3. Hypertension stable BP at target continue current treatment as well as high dose diuretic 4. No recurrent ventricular tachycardia continue amiodarone we follow his ICD in our practice 5. Lipids are optimal for this patient continue his current statin.   Next appointment: 6 months   Medication Adjustments/Labs and Tests Ordered: Current medicines are reviewed at length with the patient today.  Concerns regarding medicines are outlined above.  Orders Placed This Encounter  Procedures  . ECHOCARDIOGRAM COMPLETE   No orders of the defined types were placed in this encounter.   Chief Complaint  Patient presents with  . Follow-up    History of Present Illness:    Austin Bush is a 64 y.o. male with a hx of  SCD/VF, ICD, PVC's, CAD, CHF with most recent EF 45%, Dyslipidemia, HTN, S/P PCI seen 08/26/17 and initiated on amiodarone for VT recurrence last seen 01/17/2019. Compliance with diet, lifestyle and medications: Yes  He continues to follow his ICD in our practice his most recent download  06/13/2019 personally reviewed normal device parameters battery and function and no recurrent VT VF therapy.  Chest x-ray performed at [facility] on 01/17/2019 personally reviewed no evidence of amiodarone lung toxicity.  Labs performed at [facility] PCP 06/07/2019: Hemoglobin A1c 6.6% Cholesterol 184 HDL 71 LDL 103 triglycerides 94 CMP normal except for glucose 113 albumin 5.1 liver function tests are normal CBC normal except for white count of 11,100 his hemoglobin was normal at 14.0  Overall he is done well he still has exertional shortness of breath no edema takes high-dose diuretic and no signs amiodarone toxicity he has had no chest pain orthopnea palpitation no recent ICD therapy.  I reviewed his chest x-ray ICD download on recent labs with patient. Past Medical History:  Diagnosis Date  . AICD present, double chamber 04/23/2015  . Anemia of unknown etiology 09/17/2016  . Atherosclerotic heart disease of native coronary artery without angina pectoris 04/16/2014   PCI to the LAD  Overview:  a. Cardiac cath 02/03/2012 Severe progression of CAD, albeit with patent stents in the proximal mid LAD and mid to distal circumflex with prior intervention in 2009. Now featuring high-grade ulcerative stenosis at the junction of the proximal and mid RCA, high-grade stenosis at the origin of posterior descending branch involving the very distal portion of the RCA proxima  . Bilateral sensorineural hearing loss 07/13/2019  . BPH (benign prostatic hyperplasia) 04/23/2015  . CAD (coronary artery disease) 04/16/2014   PCI to the LAD   . Cardiac arrhythmia 04/23/2015  .  Cardiomyopathy (HCC) 12/12/2014  . Cardiomyopathy, ischemic 04/16/2014   EF 25%   . Cervical disc disease 04/23/2015  . Cervical disc disorder at C5-C6 level with radiculopathy 04/23/2015  . Chronic combined systolic and diastolic heart failure (HCC) 05/20/2015  . Chronic obstructive pulmonary disease (HCC) 04/23/2015  . Chronic systolic  (congestive) heart failure (HCC) 04/23/2015   Formatting of this note might be different from the original. EF 25-30%  . Chronic systolic CHF (congestive heart failure), NYHA class 2 (HCC) 09/09/2016  . Colon polyp 04/23/2015  . Combined systolic and diastolic congestive heart failure (HCC) 03/23/2007   Formatting of this note might be different from the original. a. NYHA Class II-IIII  b. ACC/AHA Stage C c. Echo 8/13.2013 TDS. LV chamber size is normal. Global LV wall motion and contractility are within normal limits. Estimated EF 55-60%. RV is mildly dilated. RV global systolic function is mildly reduced. Trace MR. Trace TR d. Echo 10/31/2014 LA 4.42cm. EF 25-30%. LV chamber size is mildly dila  . Congestive heart failure with left ventricular systolic dysfunction (HCC) 07/13/2019  . COPD (chronic obstructive pulmonary disease) (HCC) 04/23/2015  . Coronary artery disease involving native coronary artery of native heart with angina pectoris (HCC) 04/16/2014   PCI to the LAD  Overview:  a. Cardiac cath 02/03/2012 Severe progression of CAD, albeit with patent stents in the proximal mid LAD and mid to distal circumflex with prior intervention in 2009. Now featuring high-grade ulcerative stenosis at the junction of the proximal and mid RCA, high-grade stenosis at the origin of posterior descending branch involving the very distal portion of the RCA proximal to the bifurcation. 90% stenosis in the mid circumflex above the previously placed stent and 98% stenosis and a large very high takeoff diagonal branch of LAD coronary artery. Very distal LAD apical disease. b. reported catheterization 06/2015 without disease requiring intervention  Overview:  inferoposterior MI June 2009; 12 Sep 2009 with out of hospital SCD, hypothermia and [facility]  Cardiac cath 07/28/15: Conclusions Diagnostic Procedure Summary Mild stenosis of the Circumflex artery Otherwise no obstructive coronary artery disease. Segmental LV dysfunction of  inferior wall -moderate hypokinesis   . Diabetes, polyneuropathy (HCC) 04/24/2015  . 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 Dyslipidemia 04/16/2014  . Encounter for long-term (current) use of high-risk medication 04/23/2015  . Encounter for screening for malignant neoplasm 04/23/2015  . Essential hypertension 04/16/2014  . Fatty liver 04/23/2015  . Gastro-esophageal reflux 04/23/2015  . Gastroesophageal reflux disease with esophagitis 07/13/2019  . Generalized abdominal pain 11/10/2017  . Heart disease 07/13/2019  . High risk medication use 09/15/2016  . History of colonoscopy 07/13/2019   Formatting of this note might be different from the original. Jul 01, 2017 Entered By: Purvis SheffieldBIAS,DAVID A Comment: done 2018 repeat 2028  . History of iron deficiency anemia 06/07/2019  . History of sudden cardiac arrest 04/16/2014   08/2007   . Hyperlipidemia 04/23/2015  . Hypertensive heart disease with heart failure (HCC) 04/16/2014  . Implanted defibrillator electrode lead fracture 04/23/2015  . Insomnia 04/23/2015  . Iron deficiency anemia due to chronic blood loss 09/17/2016  . Ischemic cardiomyopathy 04/16/2014   EF 25%  Overview:  a. EF of 30% after myocardial infarction and cardiac arrest in 2009.  b. S/P dual-chamber ICD in 2009.  c. S/P ICD replacement and ICD generator change in 2012 due to lead malfunction resulting and 32  ICD shocks.  d. Echo 8/13.2013 TDS. LV chamber size is normal. Global LV wall motion and contractility are within normal limits. Estimated EF 55-60%. RV is mildly dilated. RV global systolic function is mildly reduced. Trace MR. Trace TR.  e. S/P system explant from the left with reimplantation on the right side February 1st 2016.  f. Echo 10/31/2014 LA 4.42cm. EF 25-30%. LV chamber size is mildly dilated. E:A reversal in the MV flow pattern suggestive of diastolic dysfunction. LA is mild to  moderately dilated. RV is mildly dilated. RV global systolic function is mildly reduced. Mild AR, MR, TR, trace PR. Trivial pericardial effusion is visualized IVC is dilated.  . Lateral epicondylitis 07/13/2019  . Lung nodule 04/23/2015  . Malaise and fatigue 11/10/2017  . Mixed hyperlipidemia 02/27/2016   Overview:  a. Statin therapy.   . Nocturnal leg cramps 09/15/2016  . Obesity (BMI 30.0-34.9) 04/16/2014  . Obstructive sleep apnea syndrome 04/16/2014  . Obstructive sleep apnea treated with continuous positive airway pressure (CPAP) 07/13/2019  . Old MI (myocardial infarction) 09/09/2016  . OSA on CPAP 04/16/2014  . Osteoarthritis 04/23/2015  . Osteoarthritis of knee 04/23/2015  . Peripheral neuropathy 04/23/2015  . Primary osteoarthritis of both knees 07/13/2019  . PVC (premature ventricular contraction) 02/27/2016  . S/P implantation of automatic cardioverter/defibrillator (AICD) 04/16/2014   Biotronik - history of inappropriate shocks, at least 2 lead replacements   . Steatosis of liver 07/13/2019  . Strain of right trapezius muscle 07/13/2019  . Type 2 diabetes mellitus ([place]) 07/13/2019  . Type 2 diabetes mellitus with diabetic neuropathy, unspecified ([place]) 07/13/2019  . Ventricular tachycardia ([place]) 06/15/2017  . Vitamin D deficiency 04/23/2015    Past Surgical History:  Procedure Laterality Date  . CARDIAC CATHETERIZATION    . INSERT / REPLACE / [place]  . KNEE SURGERY    . UMBILICAL HERNIA REPAIR      Current Medications: Current Meds  Medication Sig  . albuterol (VENTOLIN HFA) 108 (90 Base) MCG/ACT inhaler Inhale 2 puffs into the lungs every 6 (six) hours as needed.  Marland Kitchen amiodarone (PACERONE) 200 MG tablet TAKE 1 TABLET EVERY DAY  . aspirin EC 81 MG tablet Take 1 tablet (81 mg total) by mouth daily.  . budesonide-formoterol (SYMBICORT) 160-4.5 MCG/ACT inhaler Inhale 2 puffs into the lungs in the morning and at bedtime.  . carvedilol (COREG) 12.5 MG tablet   .  Cholecalciferol (D 1000) 1000 units capsule Take 1,000 Units by mouth daily.   Mariane Baumgarten Sodium (DSS) 100 MG CAPS Take 100 mg by mouth 2 (two) times daily.  . furosemide (LASIX) 40 MG tablet Take 40 mg by mouth 5 (five) times daily. 3 in the morning and 2 in the afternoon  . gabapentin (NEURONTIN) 300 MG capsule Take 1,800 mg by mouth daily. Takes 300 mg 6 times a day  . metFORMIN (GLUCOPHAGE) 500 MG tablet Take 500 mg by mouth 2 (two) times daily with a meal.   . methocarbamol (ROBAXIN) 500 MG tablet Take 1,000 mg by mouth 2 (two) times daily.  . Multiple Vitamin (MULTIVITAMIN) tablet Take 1 tablet by mouth daily.  . nitroGLYCERIN (NITROSTAT) 0.4 MG SL tablet Place 1 tablet (0.4 mg total) under the tongue every 5 (five) minutes as needed for chest pain.  . Omega-3 Fatty Acids (FISH OIL) 1200 MG CPDR Take 1,200 mg by mouth 3 (three) times daily.  Marland Kitchen omeprazole (PRILOSEC) 20 MG capsule Take 20 mg by mouth  2 (two) times daily.  . potassium chloride SA (KLOR-CON M20) 20 MEQ tablet Take 2 tablets (40 mEq total) by mouth daily.  . pravastatin (PRAVACHOL) 40 MG tablet Take 1 tablet (40 mg total) by mouth daily.  . sacubitril-valsartan (ENTRESTO) 24-26 MG Take 1 tablet by mouth 2 (two) times daily.     Allergies:   Patient has no known allergies.   Social History   Socioeconomic History  . Marital status: Married    Spouse name: Not on file  . Number of children: Not on file  . Years of education: Not on file  . Highest education level: Not on file  Occupational History  . Not on file  Tobacco Use  . Smoking status: Former Smoker    Years: 30.00  . Smokeless tobacco: Never Used  Substance and Sexual Activity  . Alcohol use: Yes    Alcohol/week: 1.0 standard drinks    Types: 1 Glasses of wine per week  . Drug use: No  . Sexual activity: Not on file  Other Topics Concern  . Not on file  Social History Narrative  . Not on file   Social Determinants of Health   Financial Resource  Strain:   . Difficulty of Paying Living Expenses:   Food Insecurity:   . Worried About Programme researcher, broadcasting/film/video in the Last Year:   . Barista in the Last Year:   Transportation Needs:   . Freight forwarder (Medical):   Marland Kitchen Lack of Transportation (Non-Medical):   Physical Activity:   . Days of Exercise per Week:   . Minutes of Exercise per Session:   Stress:   . Feeling of Stress :   Social Connections:   . Frequency of Communication with Friends and Family:   . Frequency of Social Gatherings with Friends and Family:   . Attends Religious Services:   . Active Member of Clubs or Organizations:   . Attends Banker Meetings:   Marland Kitchen Marital Status:      Family History: The patient's family history includes Heart attack in his maternal grandfather; Hypertension in his father; Lung cancer in his father. ROS:   Please see the history of present illness.    All other systems reviewed and are negative.  EKGs/Labs/Other Studies Reviewed:    The following studies were reviewed today:    Recent Labs: No results found for requested labs within last 8760 hours.  Recent Lipid Panel    Component Value Date/Time   CHOL 182 10/07/2017 0911   TRIG 81 10/07/2017 0911   HDL 64 10/07/2017 0911   CHOLHDL 2.8 10/07/2017 0911   LDLCALC 102 (H) 10/07/2017 0911    Physical Exam:    VS:  BP (!) 126/58   Pulse 82   Temp (!) 97.3 F (36.3 C)   Ht 6' (1.829 m)   Wt 235 lb (106.6 kg)   SpO2 97%   BMI 31.87 kg/m     Wt Readings from Last 3 Encounters:  07/16/19 235 lb (106.6 kg)  01/17/19 236 lb 3.2 oz (107.1 kg)  05/16/18 228 lb 1.9 oz (103.5 kg)     GEN:  Well nourished, well developed in no acute distress HEENT: Normal NECK: No JVD; No carotid bruits LYMPHATICS: No lymphadenopathy CARDIAC: RRR, no murmurs, rubs, gallops RESPIRATORY:  Clear to auscultation without rales, wheezing or rhonchi  ABDOMEN: Soft, non-tender, non-distended MUSCULOSKELETAL:  No edema; No  deformity  SKIN: Warm and dry NEUROLOGIC:  Alert and oriented x 3 PSYCHIATRIC:  Normal affect    Signed, Norman Herrlich, MD  07/16/2019 3:53 PM    [facility]

## 2019-07-16 ENCOUNTER — Ambulatory Visit: Payer: Medicare HMO | Admitting: Cardiology

## 2019-07-16 ENCOUNTER — Encounter: Payer: Self-pay | Admitting: Cardiology

## 2019-07-16 ENCOUNTER — Other Ambulatory Visit: Payer: Self-pay

## 2019-07-16 VITALS — BP 126/58 | HR 82 | Temp 97.3°F | Ht 72.0 in | Wt 235.0 lb

## 2019-07-16 DIAGNOSIS — I25119 Atherosclerotic heart disease of native coronary artery with unspecified angina pectoris: Secondary | ICD-10-CM

## 2019-07-16 DIAGNOSIS — I472 Ventricular tachycardia, unspecified: Secondary | ICD-10-CM

## 2019-07-16 DIAGNOSIS — I5042 Chronic combined systolic (congestive) and diastolic (congestive) heart failure: Secondary | ICD-10-CM

## 2019-07-16 DIAGNOSIS — I110 Hypertensive heart disease with heart failure: Secondary | ICD-10-CM

## 2019-07-16 DIAGNOSIS — Z95810 Presence of automatic (implantable) cardiac defibrillator: Secondary | ICD-10-CM

## 2019-07-16 DIAGNOSIS — E785 Hyperlipidemia, unspecified: Secondary | ICD-10-CM

## 2019-07-16 DIAGNOSIS — Z79899 Other long term (current) drug therapy: Secondary | ICD-10-CM

## 2019-07-16 NOTE — Patient Instructions (Signed)

## 2019-07-18 ENCOUNTER — Other Ambulatory Visit: Payer: Self-pay

## 2019-07-18 ENCOUNTER — Ambulatory Visit (INDEPENDENT_AMBULATORY_CARE_PROVIDER_SITE_OTHER): Payer: Medicare HMO

## 2019-07-18 DIAGNOSIS — I5042 Chronic combined systolic (congestive) and diastolic (congestive) heart failure: Secondary | ICD-10-CM | POA: Diagnosis not present

## 2019-07-18 NOTE — Progress Notes (Signed)
Complete echocardiogram performed.  Jimmy Adrian Dinovo RDCS, RVT  

## 2019-08-15 ENCOUNTER — Telehealth: Payer: Self-pay | Admitting: Cardiology

## 2019-08-15 NOTE — Telephone Encounter (Signed)
Spoke with patients wife regarding results and recommendation. ° °She verbalizes understanding and is agreeable to plan of care. Advised patient to call back with any issues or concerns.  °

## 2019-08-15 NOTE — Telephone Encounter (Signed)
New Message   Pts wife is calling and for test results    Please call back

## 2019-08-15 NOTE — Telephone Encounter (Signed)
I am assuming that she is calling in regards to the echo that the patient had done on 07/18/19. I do not see that this echo has been reviewed by the Dr yet. I will route to Dr. Dulce Sellar for further review of that echo.

## 2019-08-15 NOTE — Telephone Encounter (Signed)
Please thank you for calling I am not sure how this led by me.  I stopped low-dose I looked at the images myself and I think it is quite reassuring his ejection fraction is a good range of 40 to 45%

## 2019-08-22 ENCOUNTER — Other Ambulatory Visit: Payer: Self-pay | Admitting: Cardiology

## 2019-09-12 ENCOUNTER — Ambulatory Visit (INDEPENDENT_AMBULATORY_CARE_PROVIDER_SITE_OTHER): Payer: Medicare HMO | Admitting: *Deleted

## 2019-09-12 DIAGNOSIS — I472 Ventricular tachycardia, unspecified: Secondary | ICD-10-CM

## 2019-09-12 LAB — CUP PACEART REMOTE DEVICE CHECK
Date Time Interrogation Session: 20210623120841
Implantable Lead Implant Date: 20160201
Implantable Lead Implant Date: 20160201
Implantable Lead Location: 753859
Implantable Lead Location: 753860
Implantable Lead Model: 350
Implantable Lead Model: 379
Implantable Lead Serial Number: 29736564
Implantable Lead Serial Number: 49114934
Implantable Pulse Generator Implant Date: 20160201
Pulse Gen Model: 392412
Pulse Gen Serial Number: 60838040

## 2019-09-13 NOTE — Progress Notes (Signed)
Remote ICD transmission.   

## 2019-11-05 ENCOUNTER — Other Ambulatory Visit: Payer: Self-pay | Admitting: Cardiology

## 2019-12-12 ENCOUNTER — Ambulatory Visit (INDEPENDENT_AMBULATORY_CARE_PROVIDER_SITE_OTHER): Payer: Medicare HMO | Admitting: *Deleted

## 2019-12-12 DIAGNOSIS — I472 Ventricular tachycardia, unspecified: Secondary | ICD-10-CM

## 2019-12-12 LAB — CUP PACEART REMOTE DEVICE CHECK
Date Time Interrogation Session: 20210922070049
Implantable Lead Implant Date: 20160201
Implantable Lead Implant Date: 20160201
Implantable Lead Location: 753859
Implantable Lead Location: 753860
Implantable Lead Model: 350
Implantable Lead Model: 379
Implantable Lead Serial Number: 29736564
Implantable Lead Serial Number: 49114934
Implantable Pulse Generator Implant Date: 20160201
Pulse Gen Model: 392412
Pulse Gen Serial Number: 60838040

## 2019-12-14 NOTE — Progress Notes (Signed)
Remote ICD transmission.   

## 2020-01-04 ENCOUNTER — Telehealth: Payer: Self-pay

## 2020-01-04 NOTE — Telephone Encounter (Signed)
[facility] [place]

## 2020-02-16 ENCOUNTER — Other Ambulatory Visit: Payer: Self-pay | Admitting: Cardiology

## 2020-02-18 NOTE — Telephone Encounter (Signed)
Rx refill sent to pharmacy. 

## 2020-05-15 ENCOUNTER — Telehealth: Payer: Self-pay

## 2020-05-15 MED ORDER — PRAVASTATIN SODIUM 40 MG PO TABS: 40.0000 mg | ORAL_TABLET | Freq: Every day | ORAL | 1 refills | Status: AC

## 2020-05-15 MED ORDER — CARVEDILOL 12.5 MG PO TABS: 12.5000 mg | ORAL_TABLET | Freq: Two times a day (BID) | ORAL | 2 refills | Status: AC

## 2020-05-15 MED ORDER — FUROSEMIDE 40 MG PO TABS
40.0000 mg | ORAL_TABLET | Freq: Every day | ORAL | 2 refills | Status: DC
Start: 2020-05-15 — End: 2020-08-22

## 2020-05-15 MED ORDER — AMIODARONE HCL 200 MG PO TABS
200.0000 mg | ORAL_TABLET | Freq: Every day | ORAL | 1 refills | Status: DC
Start: 2020-05-15 — End: 2020-12-02

## 2020-05-15 MED ORDER — SACUBITRIL-VALSARTAN 24-26 MG PO TABS: 1.0000 | ORAL_TABLET | Freq: Two times a day (BID) | ORAL | 1 refills | Status: AC

## 2020-05-15 NOTE — Telephone Encounter (Signed)
Refill sent to pharmacy.   

## 2020-05-16 ENCOUNTER — Telehealth: Payer: Self-pay

## 2020-05-16 NOTE — Telephone Encounter (Signed)
Message sent to DOD Dr. Bing Matter: This is a [facility] patient, pharmacy is needing to clarify directions on furosemide. I tried seeking through his notes but it does not specify. Can you tell me if the patient will be taking this 3 tabs in the morning and 2 tab in the afternoon.

## 2020-05-16 NOTE — Telephone Encounter (Signed)
Per Dr. Lucianne Muss tab five times a day, 3 in am and 2 pm. I spoke to the pharmacist and clarified instructions. 3 in the morning and 2 in the afternoon. Rob Bunting was adjusted to 150 per 30 days as insurance would not approve 30Qty with the following instructions.

## 2020-07-01 ENCOUNTER — Telehealth: Payer: Self-pay | Admitting: Cardiology

## 2020-07-01 NOTE — Telephone Encounter (Signed)
Left message on patients voicemail to let him know that he will need to call medical records in order to get the medical records from all the way back in 2009 to present. I also gave him our call back number to call back with any questions or concerns.

## 2020-07-01 NOTE — Telephone Encounter (Signed)
Pt needs medical records from 09/2007 to present. Pt would like to take the records to the [facility] hospital in [place]. Pt does not have the Doctor name who is requesting the Med Records or a fax #

## 2020-07-01 NOTE — Telephone Encounter (Signed)
Left message for patient to return call.

## 2020-08-22 ENCOUNTER — Encounter: Payer: Self-pay | Admitting: Cardiology

## 2020-08-22 ENCOUNTER — Other Ambulatory Visit: Payer: Self-pay

## 2020-08-22 ENCOUNTER — Ambulatory Visit: Payer: Medicare Other | Admitting: Cardiology

## 2020-08-22 ENCOUNTER — Telehealth: Payer: Self-pay | Admitting: Cardiology

## 2020-08-22 VITALS — BP 100/60 | HR 91 | Ht 72.0 in | Wt 241.0 lb

## 2020-08-22 DIAGNOSIS — J449 Chronic obstructive pulmonary disease, unspecified: Secondary | ICD-10-CM

## 2020-08-22 DIAGNOSIS — I472 Ventricular tachycardia, unspecified: Secondary | ICD-10-CM

## 2020-08-22 DIAGNOSIS — I110 Hypertensive heart disease with heart failure: Secondary | ICD-10-CM

## 2020-08-22 DIAGNOSIS — Z95810 Presence of automatic (implantable) cardiac defibrillator: Secondary | ICD-10-CM

## 2020-08-22 DIAGNOSIS — E785 Hyperlipidemia, unspecified: Secondary | ICD-10-CM

## 2020-08-22 DIAGNOSIS — Z79899 Other long term (current) drug therapy: Secondary | ICD-10-CM

## 2020-08-22 DIAGNOSIS — I25119 Atherosclerotic heart disease of native coronary artery with unspecified angina pectoris: Secondary | ICD-10-CM | POA: Diagnosis not present

## 2020-08-22 MED ORDER — TORSEMIDE 100 MG PO TABS
50.0000 mg | ORAL_TABLET | Freq: Two times a day (BID) | ORAL | 3 refills | Status: AC
Start: 2020-08-22 — End: ?

## 2020-08-22 MED ORDER — POTASSIUM CHLORIDE CRYS ER 20 MEQ PO TBCR: 40.0000 meq | EXTENDED_RELEASE_TABLET | Freq: Every day | ORAL | 3 refills | Status: DC

## 2020-08-22 MED ORDER — TORSEMIDE 100 MG PO TABS: 50.0000 mg | ORAL_TABLET | Freq: Two times a day (BID) | ORAL | 3 refills | Status: DC

## 2020-08-22 NOTE — Patient Instructions (Signed)
Medication Instructions:  Your physician has recommended you make the following change in your medication:  STOP: Furosemide START: Torsemide 100 mg take 1/2 tablet by mouth twice daily.  *If you need a refill on your cardiac medications before your next appointment, please call your pharmacy*   Lab Work: None If you have labs (blood work) drawn today and your tests are completely normal, you will receive your results only by: Marland Kitchen MyChart Message (if you have MyChart) OR . A paper copy in the mail If you have any lab test that is abnormal or we need to change your treatment, we will call you to review the results.   Testing/Procedures: NOne   Follow-Up: At [facility], you and your health needs are our priority.  As part of our continuing mission to provide you with exceptional heart care, we have created designated Provider Care Teams.  These Care Teams include your primary Cardiologist (physician) and Advanced Practice Providers (APPs -  Physician Assistants and Nurse Practitioners) who all work together to provide you with the care you need, when you need it.  We recommend signing up for the patient portal called "MyChart".  Sign up information is provided on this After Visit Summary.  MyChart is used to connect with patients for Virtual Visits (Telemedicine).  Patients are able to view lab/test results, encounter notes, upcoming appointments, etc.  Non-urgent messages can be sent to your provider as well.   To learn more about what you can do with MyChart, go to [email or web address].    Your next appointment:   3 month(s)  The format for your next appointment:   In Person  Provider:   Norman Herrlich, MD   Other Instructions Please call us in one week if you have not improved.

## 2020-08-22 NOTE — Progress Notes (Addendum)
Cardiology Office Note:    Date:  08/22/2020   ID:  Austin Bush, DOB 05/22/1955, MRN [id number]  PCP:  Gordan PaymentGrisso, Greg A., MD  Cardiologist:  Norman HerrlichBrian Janilah Hojnacki, MD    Referring MD: Gordan PaymentGrisso, Greg A., MD    08/27/2020 Case discussed with Dr. Ch[facility] he will be having colonoscopy from a cardiac perspective he is quite stable and if needed he can withdraw from aspirin prior to the procedure.  Most recent ejection fraction 40 to 45%.  ASSESSMENT:    1. Hypertensive heart disease with heart failure (HCC)   2. Coronary artery disease involving native coronary artery of native heart with angina pectoris (HCC)   3. AICD present, double chamber   4. VT (ventricular tachycardia) (HCC)   5. On amiodarone therapy   6. Hyperlipidemia, unspecified hyperlipidemia type   7. Chronic obstructive pulmonary disease, unspecified COPD type (HCC)    PLAN:    In order of problems listed above:  1. Were sent he has marked edema diuretic resistance transition from furosemide to torsemide if not improved to require addition of metolazone and that would be a trigger for me to repeat his echocardiogram.  He is on good guideline directed therapy will continue carvedilol and Entresto. 2. Stable CAD no anginal discomfort continue medical therapy including aspirin statin 3. Stable ICD followed in our practice 4. Stable no recurrent VT on amiodarone 5. Lipids at target continue statin 6. [facility]   Next appointment: 3 months   Medication Adjustments/Labs and Tests Ordered: Current medicines are reviewed at length with the patient today.  Concerns regarding medicines are outlined above.  Orders Placed This Encounter  Procedures  . EKG 12-Lead   Meds ordered this encounter  Medications  . torsemide (DEMADEX) 100 MG tablet    Sig: Take 0.5 tablets (50 mg total) by mouth 2 (two) times daily.    Dispense:  90 tablet    Refill:  3    Chief Complaint   Patient presents with  . Follow-up    History of Present Illness:    Austin AuerMichael Bush is a 65 y.o. male with a hx of sudden cardiac death with a VF arrest successfully resuscitated IVC PVCs CAD heart failure dyslipidemia hypertension PCI 2019 and amiodarone for VT recurrence last seen 07/15/2020.    His last echocardiogram 07/18/2019 showed ejection fraction of 40 to 45% with normal diastolic filling pressures.  Compliance with diet, lifestyle and medications: Yes  He finds himself intermittently short of breath with and without activity his weight has not changed but he notices a great deal more edema.  He does not seem to be responding to furosemide he does take gabapentin that can cause sodium retention.  He is not having orthopnea chest pain palpitation or syncope.  Clinically has diuretic resistance we will transition furosemide to torsemide and I told him if he does not have a significant weight loss and improvement week to call me.  [facility]: Cholesterol 126 LDL 61 triglycerides 89 HDL 57 non-HDL 69, lipids are at target CMP shows a potassium 4.7 creatinine 1.14 GFR 72 cc normal liver function test Hemoglobin 12.8 iron studies show ferritin of 50 saturation of 17% and iron of 70.  [facility]: Cholesterol 113 LDL 35 triglycerides 122 HDL 54  Had noncontrast CT of the chest 08/12/2020 which showed unchanged 7 mm right lower lobe nodule stable since 2015 lungs showed biapical  scarring minimal atelectasis no findings of amiodarone toxicity the thoracic aorta was normal in contour and appearance.  He has been seen by pulmonary for COPD and increasing shortness of breath with activity.  PFTs were performed I am unable to see the report within epic.  He had VATS equivalent of 100 of furosemide Past Medical History:  Diagnosis Date  . AICD present, double chamber 04/23/2015  . Anemia of unknown etiology 09/17/2016  .  Atherosclerotic heart disease of native coronary artery without angina pectoris 04/16/2014   PCI to the LAD  Overview:  a. Cardiac cath 02/03/2012 Severe progression of CAD, albeit with patent stents in the proximal mid LAD and mid to distal circumflex with prior intervention in 2009. Now featuring high-grade ulcerative stenosis at the junction of the proximal and mid RCA, high-grade stenosis at the origin of posterior descending branch involving the very distal portion of the RCA proxima  . Bilateral sensorineural hearing loss 07/13/2019  . BPH (benign prostatic hyperplasia) 04/23/2015  . CAD (coronary artery disease) 04/16/2014   PCI to the LAD   . Cardiac arrhythmia 04/23/2015  . Cardiomyopathy (HCC) 12/12/2014  . Cardiomyopathy, ischemic 04/16/2014   EF 25%   . Cervical disc disease 04/23/2015  . Cervical disc disorder at C5-C6 level with radiculopathy 04/23/2015  . Chronic combined systolic and diastolic heart failure (HCC) 05/20/2015  . Chronic obstructive pulmonary disease (HCC) 04/23/2015  . Chronic systolic (congestive) heart failure (HCC) 04/23/2015   Formatting of this note might be different from the original. EF 25-30%  . Chronic systolic CHF (congestive heart failure), NYHA class 2 (HCC) 09/09/2016  . Colon polyp 04/23/2015  . Combined systolic and diastolic congestive heart failure (HCC) 03/23/2007   Formatting of this note might be different from the original. a. NYHA Class II-IIII  b. ACC/AHA Stage C c. Echo 8/13.2013 TDS. LV chamber size is normal. Global LV wall motion and contractility are within normal limits. Estimated EF 55-60%. RV is mildly dilated. RV global systolic function is mildly reduced. Trace MR. Trace TR d. Echo 10/31/2014 LA 4.42cm. EF 25-30%. LV chamber size is mildly dila  . Congestive heart failure with left ventricular systolic dysfunction (HCC) 07/13/2019  . COPD (chronic obstructive pulmonary disease) (HCC) 04/23/2015  . Coronary artery disease involving native coronary artery of  native heart with angina pectoris (HCC) 04/16/2014   PCI to the LAD  Overview:  a. Cardiac cath 02/03/2012 Severe progression of CAD, albeit with patent stents in the proximal mid LAD and mid to distal circumflex with prior intervention in 2009. Now featuring high-grade ulcerative stenosis at the junction of the proximal and mid RCA, high-grade stenosis at the origin of posterior descending branch involving the very distal portion of the RCA proximal to the bifurcation. 90% stenosis in the mid circumflex above the previously placed stent and 98% stenosis and a large very high takeoff diagonal branch of LAD coronary artery. Very distal LAD apical disease. b. reported catheterization 06/2015 without disease requiring intervention  Overview:  inferoposterior MI June 2009; 12 Sep 2009 with out of hospital SCD, hypothermia and PCI at [facility]  Cardiac cath 07/28/15: Conclusions Diagnostic Procedure Summary Mild stenosis of the Circumflex artery Otherwise no obstructive coronary artery disease. Segmental LV dysfunction of inferior wall -moderate hypokinesis   . Diabetes, polyneuropathy (HCC) 04/24/2015  . 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 Dyslipidemia 04/16/2014  . Encounter for long-term (current)  use of high-risk medication 04/23/2015  . Encounter for screening for malignant neoplasm 04/23/2015  . Essential hypertension 04/16/2014  . Fatty liver 04/23/2015  . Gastro-esophageal reflux 04/23/2015  . Gastroesophageal reflux disease with esophagitis 07/13/2019  . Generalized abdominal pain 11/10/2017  . Heart disease 07/13/2019  . High risk medication use 09/15/2016  . History of colonoscopy 07/13/2019   Formatting of this note might be different from the original. Jul 01, 2017 Entered By: Purvis Sheffield A Comment: done 2018 repeat 2028  . History of iron deficiency anemia 06/07/2019  . History of sudden cardiac arrest  04/16/2014   08/2007   . Hyperlipidemia 04/23/2015  . Hypertensive heart disease with heart failure (HCC) 04/16/2014  . Implanted defibrillator electrode lead fracture 04/23/2015  . Insomnia 04/23/2015  . Iron deficiency anemia due to chronic blood loss 09/17/2016  . Ischemic cardiomyopathy 04/16/2014   EF 25%  Overview:  a. EF of 30% after myocardial infarction and cardiac arrest in 2009.  b. S/P dual-chamber ICD in 2009.  c. S/P ICD replacement and ICD generator change in 2012 due to lead malfunction resulting and 32 ICD shocks.  d. Echo 8/13.2013 TDS. LV chamber size is normal. Global LV wall motion and contractility are within normal limits. Estimated EF 55-60%. RV is mildly dilated. RV global systolic function is mildly reduced. Trace MR. Trace TR.  e. S/P system explant from the left with reimplantation on the right side February 1st 2016.  f. Echo 10/31/2014 LA 4.42cm. EF 25-30%. LV chamber size is mildly dilated. E:A reversal in the MV flow pattern suggestive of diastolic dysfunction. LA is mild to moderately dilated. RV is mildly dilated. RV global systolic function is mildly reduced. Mild AR, MR, TR, trace PR. Trivial pericardial effusion is visualized IVC is dilated.  . Lateral epicondylitis 07/13/2019  . Lung nodule 04/23/2015  . Malaise and fatigue 11/10/2017  . Mixed hyperlipidemia 02/27/2016   Overview:  a. Statin therapy.   . Nocturnal leg cramps 09/15/2016  . Obesity (BMI 30.0-34.9) 04/16/2014  . Obstructive sleep apnea syndrome 04/16/2014  . Obstructive sleep apnea treated with continuous positive airway pressure (CPAP) 07/13/2019  . Old MI (myocardial infarction) 09/09/2016  . OSA on CPAP 04/16/2014  . Osteoarthritis 04/23/2015  . Osteoarthritis of knee 04/23/2015  . Peripheral neuropathy 04/23/2015  . Primary osteoarthritis of both knees 07/13/2019  . PVC (premature ventricular contraction) 02/27/2016  . S/P implantation of automatic cardioverter/defibrillator (AICD) 04/16/2014   Biotronik - history  of inappropriate shocks, at least 2 lead replacements   . Steatosis of liver 07/13/2019  . Strain of right trapezius muscle 07/13/2019  . Type 2 diabetes mellitus (HCC) 07/13/2019  . Type 2 diabetes mellitus with diabetic neuropathy, unspecified (HCC) 07/13/2019  . Ventricular tachycardia (HCC) 06/15/2017  . Vitamin D deficiency 04/23/2015    Past Surgical History:  Procedure Laterality Date  . CARDIAC CATHETERIZATION    . INSERT / REPLACE / REMOVE PACEMAKER     Biotronik AICD  . KNEE SURGERY    . UMBILICAL HERNIA REPAIR      Current Medications: Current Meds  Medication Sig  . albuterol (VENTOLIN HFA) 108 (90 Base) MCG/ACT inhaler Inhale 2 puffs into the lungs every 6 (six) hours as needed.  Marland Kitchen amiodarone (PACERONE) 200 MG tablet Take 1 tablet (200 mg total) by mouth daily.  Marland Kitchen aspirin EC 81 MG tablet Take 1 tablet (81 mg total) by mouth daily.  . budesonide-formoterol (SYMBICORT) 160-4.5 MCG/ACT inhaler Inhale 2 puffs into the lungs in  the morning and at bedtime.  . carboxymethylcellulose (REFRESH PLUS) 0.5 % SOLN Place 1 drop into both eyes as needed (dry eyes).  . carvedilol (COREG) 12.5 MG tablet Take 1 tablet (12.5 mg total) by mouth 2 (two) times daily with a meal.  . Cholecalciferol 25 MCG (1000 UT) capsule Take 1,000 Units by mouth daily.   . diclofenac Sodium (VOLTAREN) 1 % GEL Apply 4 g topically 4 (four) times daily as needed for pain.  Tery Sanfilippo Sodium (DSS) 100 MG CAPS Take 100 mg by mouth 2 (two) times daily.  . fluticasone-salmeterol (ADVAIR) 250-50 MCG/ACT AEPB Take 1 puff by mouth in the morning and at bedtime.  . gabapentin (NEURONTIN) 300 MG capsule Take 600 mg by mouth 3 (three) times daily.  . metFORMIN (GLUCOPHAGE) 500 MG tablet Take 500 mg by mouth 2 (two) times daily with a meal.   . methocarbamol (ROBAXIN) 500 MG tablet Take 1,000 mg by mouth 2 (two) times daily.  . Multiple Vitamin (MULTIVITAMIN) tablet Take 1 tablet by mouth daily.  . nitroGLYCERIN (NITROSTAT)  0.4 MG SL tablet DISSOLVE 1 TABLET UNDER THE TONGUE EVERY 5 MINUTES AS NEEDED FOR CHEST PAIN.  Marland Kitchen Omega-3 Fatty Acids (FISH OIL) 1200 MG CPDR Take 1,200 mg by mouth 3 (three) times daily.  Marland Kitchen omeprazole (PRILOSEC) 20 MG capsule Take 20 mg by mouth 2 (two) times daily.  . potassium chloride SA (KLOR-CON) 20 MEQ tablet TAKE 2 TABLETS EVERY DAY  . pravastatin (PRAVACHOL) 40 MG tablet Take 1 tablet (40 mg total) by mouth daily.  . sacubitril-valsartan (ENTRESTO) 24-26 MG Take 1 tablet by mouth 2 (two) times daily.  Marland Kitchen torsemide (DEMADEX) 100 MG tablet Take 0.5 tablets (50 mg total) by mouth 2 (two) times daily.  . [DISCONTINUED] furosemide (LASIX) 40 MG tablet Take 1 tablet (40 mg total) by mouth 5 (five) times daily. 3 in the morning and 2 in the afternoon     Allergies:   Patient has no known allergies.   Social History   Socioeconomic History  . Marital status: Married    Spouse name: Not on file  . Number of children: Not on file  . Years of education: Not on file  . Highest education level: Not on file  Occupational History  . Not on file  Tobacco Use  . Smoking status: Former Smoker    Years: 30.00  . Smokeless tobacco: Never Used  Vaping Use  . Vaping Use: Never used  Substance and Sexual Activity  . Alcohol use: Yes    Alcohol/week: 1.0 standard drink    Types: 1 Glasses of wine per week  . Drug use: No  . Sexual activity: Not on file  Other Topics Concern  . Not on file  Social History Narrative  . Not on file   Social Determinants of Health   Financial Resource Strain: Not on file  Food Insecurity: Not on file  Transportation Needs: Not on file  Physical Activity: Not on file  Stress: Not on file  Social Connections: Not on file     Family History: The patient's family history includes Heart attack in his maternal grandfather; Hypertension in his father; Lung cancer in his father. ROS:   Please see the history of present illness.    All other systems reviewed  and are negative.  EKGs/Labs/Other Studies Reviewed:    The following studies were reviewed today:  EKG:  EKG ordered today and personally reviewed.  The ekg ordered today demonstrates sinus  rhythm normal EKG  Recent Labs: No results found for requested labs within last 8760 hours.  Recent Lipid Panel    Component Value Date/Time   CHOL 182 10/07/2017 0911   TRIG 81 10/07/2017 0911   HDL 64 10/07/2017 0911   CHOLHDL 2.8 10/07/2017 0911   LDLCALC 102 (H) 10/07/2017 0911    Physical Exam:    VS:  BP 100/60 (BP Location: Right Arm, Patient Position: Sitting, Cuff Size: Normal)   Pulse 91   Ht 6' (1.829 m)   Wt 241 lb (109.3 kg)   SpO2 97%   BMI 32.69 kg/m     Wt Readings from Last 3 Encounters:  08/22/20 241 lb (109.3 kg)  07/16/19 235 lb (106.6 kg)  01/17/19 236 lb 3.2 oz (107.1 kg)     GEN:  Well nourished, well developed in no acute distress HEENT: Normal NECK: No JVD; No carotid bruits LYMPHATICS: No lymphadenopathy CARDIAC: RRR, no murmurs, rubs, gallops RESPIRATORY:  Clear to auscultation without rales, wheezing or rhonchi  ABDOMEN: Soft, non-tender, non-distended MUSCULOSKELETAL: Bilateral 4+ lower extremity pitting edema; No deformity  SKIN: Warm and dry NEUROLOGIC:  Alert and oriented x 3 PSYCHIATRIC:  Normal affect    Signed, Norman Herrlich, MD  08/22/2020 2:26 PM    [facility]

## 2020-08-22 NOTE — Telephone Encounter (Signed)
Refills sent in per request 

## 2020-08-22 NOTE — Telephone Encounter (Signed)
Pt dropped off Amiodarone prescription information   [facility] [address] [place] , [place] [address]   Needs to be sent here!   Thank you!

## 2020-08-25 ENCOUNTER — Telehealth: Payer: Self-pay

## 2020-08-25 ENCOUNTER — Telehealth: Payer: Self-pay | Admitting: Cardiology

## 2020-08-25 MED ORDER — POTASSIUM CHLORIDE 20 MEQ PO PACK: 40.0000 meq | PACK | Freq: Every day | ORAL | 3 refills | Status: DC

## 2020-08-25 NOTE — Telephone Encounter (Signed)
New prescription sent in per request

## 2020-08-25 NOTE — Telephone Encounter (Signed)
Pt c/o medication issue:  1. Name of Medication:  potassium chloride (KLOR-CON) 20 MEQ packet  2. How are you currently taking this medication (dosage and times per day)?  No   3. Are you having a reaction (difficulty breathing--STAT)?  No   4. What is your medication issue?   Luke with [facility] is requesting to clarify dosage instructions for potassium chloride (KLOR-CON) 20 MEQ packet.  Phone #: [phone] (reference #: [id number])

## 2020-08-25 NOTE — Telephone Encounter (Signed)
Received a fax from the pharmacy stating that Klor Con does not exist. Potassium Chloride 20 mEq is available in 2 distinct forms and they are not interchangeable.   Please specify a dosage form so we may dispense the correct medication.  Potassium Cl Wax matrix form (generic for K-tab) or Potassium Cl crystals/particles form (generic for Klor Con M20.

## 2020-08-26 NOTE — Telephone Encounter (Signed)
Spoke with the pharmacy just now and clarified with them that this should be 40 meq daily. They verbalized understanding.

## 2020-09-23 ENCOUNTER — Telehealth: Payer: Self-pay | Admitting: Cardiology

## 2020-09-23 MED ORDER — POTASSIUM CHLORIDE 20 MEQ PO PACK: 40.0000 meq | PACK | Freq: Every day | ORAL | 3 refills | Status: DC

## 2020-09-23 NOTE — Telephone Encounter (Signed)
*  STAT* If patient is at the pharmacy, call can be transferred to refill team.   1. Which medications need to be refilled? (please list name of each medication and dose if known)  potassium chloride (KLOR-CON) 20 MEQ packet  2. Which pharmacy/location (including street and city if local pharmacy) is medication to be sent to? [facility]  [facility]) - [place], [place] - [address]  3. Do they need a 30 day or 90 day supply? 90 day supply

## 2020-09-23 NOTE — Telephone Encounter (Signed)
Patient calling the office for samples of medication:   1.  What medication and dosage are you requesting samples for? potassium chloride (KLOR-CON) 20 MEQ packet  2.  Are you currently out of this medication? yes

## 2020-09-23 NOTE — Telephone Encounter (Signed)
Refill sent in per request. I also let him know that we do not carry any samples for this.   Encouraged patient to call back with any questions or concerns.

## 2020-09-23 NOTE — Telephone Encounter (Signed)
Pt is completely out of this medicine. Please see refill request below

## 2020-09-23 NOTE — Telephone Encounter (Signed)
Refill sent in per request.  

## 2020-09-23 NOTE — Telephone Encounter (Signed)
*  STAT* If patient is at the pharmacy, call can be transferred to refill team.   1. Which medications need to be refilled? (please list name of each medication and dose if known)  potassium chloride (KLOR-CON) 20 MEQ packet  2. Which pharmacy/location (including street and city if local pharmacy) is medication to be sent to? [facility] - [place], [place] - [address]  3. Do they need a 30 day or 90 day supply? 90 day supply

## 2020-09-24 ENCOUNTER — Other Ambulatory Visit: Payer: Self-pay | Admitting: Cardiology

## 2020-09-24 ENCOUNTER — Telehealth: Payer: Self-pay | Admitting: Cardiology

## 2020-09-24 MED ORDER — POTASSIUM CHLORIDE CRYS ER 20 MEQ PO TBCR: 40.0000 meq | EXTENDED_RELEASE_TABLET | Freq: Every day | ORAL | 3 refills | Status: DC

## 2020-09-24 NOTE — Telephone Encounter (Signed)
Spoke to the patients wife and the patient just now and let them know that I sent in the tablet for them to take.    Encouraged patient to call back with any questions or concerns.

## 2020-09-24 NOTE — Telephone Encounter (Signed)
Pt's wife called back again, stated that the script for Potassium at the pharmacy is the incorrect script because the pt has never taken Potassium in powder form. Pt's wife states that pt's potassium is really low and she would rather have the tablet form instead of the powder. The pt. Is in the background stating that he'll take whatever he needs to take as long as he gets the potassium but the pt's wife would rather pt. Have the potassium tablet. Please advise pt's wife further

## 2020-09-27 ENCOUNTER — Emergency Department (HOSPITAL_COMMUNITY): Payer: Medicare Other

## 2020-09-27 ENCOUNTER — Encounter (HOSPITAL_COMMUNITY): Payer: Self-pay | Admitting: Emergency Medicine

## 2020-09-27 ENCOUNTER — Other Ambulatory Visit: Payer: Self-pay

## 2020-09-27 ENCOUNTER — Emergency Department (HOSPITAL_COMMUNITY)
Admission: EM | Admit: 2020-09-27 | Discharge: 2020-09-27 | Disposition: A | Discharge status: Home / Self Care | Payer: Medicare Other | Attending: Emergency Medicine | Admitting: Emergency Medicine

## 2020-09-27 DIAGNOSIS — I25119 Atherosclerotic heart disease of native coronary artery with unspecified angina pectoris: Secondary | ICD-10-CM | POA: Diagnosis not present

## 2020-09-27 DIAGNOSIS — Z950 Presence of cardiac pacemaker: Secondary | ICD-10-CM | POA: Diagnosis not present

## 2020-09-27 DIAGNOSIS — R109 Unspecified abdominal pain: Secondary | ICD-10-CM | POA: Diagnosis not present

## 2020-09-27 DIAGNOSIS — R110 Nausea: Secondary | ICD-10-CM

## 2020-09-27 DIAGNOSIS — E1140 Type 2 diabetes mellitus with diabetic neuropathy, unspecified: Secondary | ICD-10-CM | POA: Diagnosis not present

## 2020-09-27 DIAGNOSIS — I110 Hypertensive heart disease with heart failure: Secondary | ICD-10-CM | POA: Diagnosis not present

## 2020-09-27 DIAGNOSIS — Z79899 Other long term (current) drug therapy: Secondary | ICD-10-CM | POA: Diagnosis not present

## 2020-09-27 DIAGNOSIS — E1142 Type 2 diabetes mellitus with diabetic polyneuropathy: Secondary | ICD-10-CM | POA: Diagnosis not present

## 2020-09-27 DIAGNOSIS — Z7951 Long term (current) use of inhaled steroids: Secondary | ICD-10-CM | POA: Insufficient documentation

## 2020-09-27 DIAGNOSIS — Z7982 Long term (current) use of aspirin: Secondary | ICD-10-CM | POA: Diagnosis not present

## 2020-09-27 DIAGNOSIS — J449 Chronic obstructive pulmonary disease, unspecified: Secondary | ICD-10-CM | POA: Diagnosis not present

## 2020-09-27 DIAGNOSIS — R112 Nausea with vomiting, unspecified: Secondary | ICD-10-CM | POA: Diagnosis not present

## 2020-09-27 DIAGNOSIS — Z87891 Personal history of nicotine dependence: Secondary | ICD-10-CM | POA: Diagnosis not present

## 2020-09-27 DIAGNOSIS — I5022 Chronic systolic (congestive) heart failure: Secondary | ICD-10-CM | POA: Insufficient documentation

## 2020-09-27 DIAGNOSIS — Z7984 Long term (current) use of oral hypoglycemic drugs: Secondary | ICD-10-CM | POA: Diagnosis not present

## 2020-09-27 LAB — URINALYSIS, ROUTINE W REFLEX MICROSCOPIC
Bilirubin Urine: NEGATIVE
Glucose, UA: NEGATIVE mg/dL
Hgb urine dipstick: NEGATIVE
Ketones, ur: NEGATIVE mg/dL
Leukocytes,Ua: NEGATIVE
Nitrite: NEGATIVE
Protein, ur: NEGATIVE mg/dL
Specific Gravity, Urine: 1.01 (ref 1.005–1.030)
pH: 6 (ref 5.0–8.0)

## 2020-09-27 LAB — CBC
HCT: 47.1 % (ref 39.0–52.0)
Hemoglobin: 15.3 g/dL (ref 13.0–17.0)
MCH: 26.8 pg (ref 26.0–34.0)
MCHC: 32.5 g/dL (ref 30.0–36.0)
MCV: 82.6 fL (ref 80.0–100.0)
Platelets: 296 10*3/uL (ref 150–400)
RBC: 5.7 MIL/uL (ref 4.22–5.81)
RDW: 14.6 % (ref 11.5–15.5)
WBC: 9.8 10*3/uL (ref 4.0–10.5)
nRBC: 0 % (ref 0.0–0.2)

## 2020-09-27 LAB — COMPREHENSIVE METABOLIC PANEL
ALT: 56 U/L — ABNORMAL HIGH (ref 0–44)
AST: 29 U/L (ref 15–41)
Albumin: 4.2 g/dL (ref 3.5–5.0)
Alkaline Phosphatase: 63 U/L (ref 38–126)
Anion gap: 12 (ref 5–15)
BUN: 13 mg/dL (ref 8–23)
CO2: 28 mmol/L (ref 22–32)
Calcium: 9.8 mg/dL (ref 8.9–10.3)
Chloride: 96 mmol/L — ABNORMAL LOW (ref 98–111)
Creatinine, Ser: 0.97 mg/dL (ref 0.61–1.24)
GFR, Estimated: >60 mL/min (ref >60)
Glucose, Bld: 132 mg/dL — ABNORMAL HIGH (ref 70–99)
Potassium: 3.5 mmol/L (ref 3.5–5.1)
Sodium: 136 mmol/L (ref 135–145)
Total Bilirubin: 0.9 mg/dL (ref 0.3–1.2)
Total Protein: 8.1 g/dL (ref 6.5–8.1)

## 2020-09-27 LAB — LIPASE, BLOOD: Lipase: 28 U/L (ref 11–51)

## 2020-09-27 MED ORDER — ONDANSETRON HCL 4 MG/2ML IJ SOLN
4.0000 mg | Freq: Once | INTRAMUSCULAR | Status: AC
  Administered 2020-09-27: 4 mg via INTRAVENOUS
  Filled 2020-09-27: qty 2

## 2020-09-27 MED ORDER — PROMETHAZINE HCL 25 MG RE SUPP: 25.0000 mg | Freq: Four times a day (QID) | RECTAL | 0 refills | Status: DC | PRN

## 2020-09-27 MED ORDER — PROMETHAZINE HCL 25 MG PO TABS: 25.0000 mg | ORAL_TABLET | Freq: Four times a day (QID) | ORAL | 0 refills | Status: DC | PRN

## 2020-09-27 MED ORDER — IOHEXOL 300 MG/ML  SOLN
100.0000 mL | Freq: Once | INTRAMUSCULAR | Status: AC | PRN
  Administered 2020-09-27: 100 mL via INTRAVENOUS

## 2020-09-27 MED ORDER — SODIUM CHLORIDE 0.9 % IV BOLUS
500.0000 mL | Freq: Once | INTRAVENOUS | Status: AC
  Administered 2020-09-27: 500 mL via INTRAVENOUS

## 2020-09-27 NOTE — Discharge Instructions (Addendum)
Return for any problem.  ?

## 2020-09-27 NOTE — ED Triage Notes (Signed)
C/o generalized abd pain x 8 days with nausea and vomiting.  States he was admitted to [facility] on Sunday and discharged on Monday for same.  Denies diarrhea.

## 2020-09-27 NOTE — ED Notes (Signed)
Pt given water 

## 2020-09-27 NOTE — ED Notes (Signed)
Pt reporting lower right abdominal pain, tender upon palpation.

## 2020-09-27 NOTE — ED Provider Notes (Signed)
[facility] EMERGENCY DEPARTMENT Provider Note   CSN: [id number] Arrival date & time: 09/27/20  1716     History No chief complaint on file.   Austin Bush is a 65 y.o. male.  65 year old male with prior medical history as detailed below presents for evaluation of persistent nausea and vomiting.  Patient reports recent admission to hospital in [facility] for same complaint.  Patient reports that clear cause of his nausea was not found.  He was discharged roughly 1 week prior.  Patient complains of persistent intermittent nausea.  He has been taking Zofran with minimal improvement in his symptoms.  He denies associated fever.  He denies significant change in bowel movements.  The history is provided by the patient and medical records.  Illness Location:  Nausea, abdominal pain Severity:  Mild Onset quality:  Gradual Duration:  2 weeks Timing:  Constant Progression:  Waxing and waning Chronicity:  Recurrent     Past Medical History:  Diagnosis Date   AICD present, double chamber 04/23/2015   Anemia of unknown etiology 09/17/2016   Atherosclerotic heart disease of native coronary artery without angina pectoris 04/16/2014   PCI to the LAD  Overview:  a. Cardiac cath 02/03/2012 Severe progression of CAD, albeit with patent stents in the proximal mid LAD and mid to distal circumflex with prior intervention in 2009. Now featuring high-grade ulcerative stenosis at the junction of the proximal and mid RCA, high-grade stenosis at the origin of posterior descending branch involving the very distal portion of the RCA proxima   Bilateral sensorineural hearing loss 07/13/2019   BPH (benign prostatic hyperplasia) 04/23/2015   CAD (coronary artery disease) 04/16/2014   PCI to the LAD    Cardiac arrhythmia 04/23/2015   Cardiomyopathy (HCC) 12/12/2014   Cardiomyopathy, ischemic 04/16/2014   EF 25%    Cervical disc disease 04/23/2015   Cervical disc disorder at C5-C6 level with  radiculopathy 04/23/2015   Chronic combined systolic and diastolic heart failure (HCC) 05/20/2015   Chronic obstructive pulmonary disease (HCC) 04/23/2015   Chronic systolic (congestive) heart failure (HCC) 04/23/2015   Formatting of this note might be different from the original. EF 25-30%   Chronic systolic CHF (congestive heart failure), NYHA class 2 (HCC) 09/09/2016   Colon polyp 04/23/2015   Combined systolic and diastolic congestive heart failure (HCC) 03/23/2007   Formatting of this note might be different from the original. a. NYHA Class II-IIII  b. ACC/AHA Stage C c. Echo 8/13.2013 TDS. LV chamber size is normal. Global LV wall motion and contractility are within normal limits. Estimated EF 55-60%. RV is mildly dilated. RV global systolic function is mildly reduced. Trace MR. Trace TR d. Echo 10/31/2014 LA 4.42cm. EF 25-30%. LV chamber size is mildly dila   Congestive heart failure with left ventricular systolic dysfunction (HCC) 07/13/2019   COPD (chronic obstructive pulmonary disease) (HCC) 04/23/2015   Coronary artery disease involving native coronary artery of native heart with angina pectoris (HCC) 04/16/2014   PCI to the LAD  Overview:  a. Cardiac cath 02/03/2012 Severe progression of CAD, albeit with patent stents in the proximal mid LAD and mid to distal circumflex with prior intervention in 2009. Now featuring high-grade ulcerative stenosis at the junction of the proximal and mid RCA, high-grade stenosis at the origin of posterior descending branch involving the very distal portion of the RCA proximal to the bifurcation. 90% stenosis in the mid circumflex above the previously placed stent and 98% stenosis and a large very  high takeoff diagonal branch of LAD coronary artery. Very distal LAD apical disease. b. reported catheterization 06/2015 without disease requiring intervention  Overview:  inferoposterior MI June 2009; 12 Sep 2009 with out of hospital SCD, hypothermia and PCI at [facility]  Cardiac cath  07/28/15: Conclusions Diagnostic Procedure Summary Mild stenosis of the Circumflex artery Otherwise no obstructive coronary artery disease. Segmental LV dysfunction of inferior wall -moderate hypokinesis    Diabetes, polyneuropathy (HCC) 04/24/2015   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 04/16/2014   Biotronik - history of inappropriate shocks, at least 2 lead replacements   Dyslipidemia 04/16/2014   Encounter for long-term (current) use of high-risk medication 04/23/2015   Encounter for screening for malignant neoplasm 04/23/2015   Essential hypertension 04/16/2014   Fatty liver 04/23/2015   Gastro-esophageal reflux 04/23/2015   Gastroesophageal reflux disease with esophagitis 07/13/2019   Generalized abdominal pain 11/10/2017   Heart disease 07/13/2019   High risk medication use 09/15/2016   History of colonoscopy 07/13/2019   Formatting of this note might be different from the original. Jul 01, 2017 Entered By: Purvis Sheffield A Comment: done 2018 repeat 2028   History of iron deficiency anemia 06/07/2019   History of sudden cardiac arrest 04/16/2014   08/2007    Hyperlipidemia 04/23/2015   Hypertensive heart disease with heart failure (HCC) 04/16/2014   Implanted defibrillator electrode lead fracture 04/23/2015   Insomnia 04/23/2015   Iron deficiency anemia due to chronic blood loss 09/17/2016   Ischemic cardiomyopathy 04/16/2014   EF 25%  Overview:  a. EF of 30% after myocardial infarction and cardiac arrest in 2009.  b. S/P dual-chamber ICD in 2009.  c. S/P ICD replacement and ICD generator change in 2012 due to lead malfunction resulting and 32 ICD shocks.  d. Echo 8/13.2013 TDS. LV chamber size is normal. Global LV wall motion and contractility are within normal limits. Estimated EF 55-60%. RV is mildly dilated. RV global systolic function is mildly reduced. Trace MR. Trace TR.  e. S/P system explant from the left with reimplantation on the right side February 1st 2016.  f. Echo 10/31/2014 LA  4.42cm. EF 25-30%. LV chamber size is mildly dilated. E:A reversal in the MV flow pattern suggestive of diastolic dysfunction. LA is mild to moderately dilated. RV is mildly dilated. RV global systolic function is mildly reduced. Mild AR, MR, TR, trace PR. Trivial pericardial effusion is visualized IVC is dilated.   Lateral epicondylitis 07/13/2019   Lung nodule 04/23/2015   Malaise and fatigue 11/10/2017   Mixed hyperlipidemia 02/27/2016   Overview:  a. Statin therapy.    Nocturnal leg cramps 09/15/2016   Obesity (BMI 30.0-34.9) 04/16/2014   Obstructive sleep apnea syndrome 04/16/2014   Obstructive sleep apnea treated with continuous positive airway pressure (CPAP) 07/13/2019   Old MI (myocardial infarction) 09/09/2016   OSA on CPAP 04/16/2014   Osteoarthritis 04/23/2015   Osteoarthritis of knee 04/23/2015   Peripheral neuropathy 04/23/2015   Primary osteoarthritis of both knees 07/13/2019   PVC (premature ventricular contraction) 02/27/2016   S/P implantation of automatic cardioverter/defibrillator (AICD) 04/16/2014   Biotronik - history of inappropriate shocks, at least 2 lead replacements    Steatosis of liver 07/13/2019   Strain of right trapezius muscle 07/13/2019   Type 2 diabetes mellitus (HCC) 07/13/2019   Type 2 diabetes mellitus with diabetic neuropathy, unspecified (HCC) 07/13/2019   Ventricular tachycardia (HCC) 06/15/2017   Vitamin D deficiency 04/23/2015    Patient Active Problem List   Diagnosis Date Noted  Adult-onset obesity 07/13/2019   Anxiety 07/13/2019   Bilateral sensorineural hearing loss 07/13/2019   Gastroesophageal reflux disease with esophagitis 07/13/2019   History of colonoscopy 07/13/2019   Lateral epicondylitis 07/13/2019   Presbyopia 07/13/2019   Primary osteoarthritis of both knees 07/13/2019   Strain of right trapezius muscle 07/13/2019   Type 2 diabetes mellitus (HCC) 07/13/2019   Type 2 diabetes mellitus with diabetic neuropathy, unspecified (HCC) 07/13/2019   Heart  disease 07/13/2019   Encounter for fitting and adjustment of other devices related to nervous system and special senses 07/13/2019   Encounter for immunization 07/13/2019   Other specified counseling 07/13/2019   Steatosis of liver 07/13/2019   Congestive heart failure with left ventricular systolic dysfunction (HCC) 07/13/2019   Solitary pulmonary nodule 07/13/2019   Obstructive sleep apnea treated with continuous positive airway pressure (CPAP) 07/13/2019   History of iron deficiency anemia 06/07/2019   S/p small bowel obstruction 03/26/2019   Chronic pain in left shoulder 02/06/2019   Malaise and fatigue 11/10/2017   Generalized abdominal pain 11/10/2017   On amiodarone therapy 10/07/2017   Ventricular tachycardia (HCC) 06/15/2017   Anemia of unknown etiology 09/17/2016   Iron deficiency anemia due to chronic blood loss 09/17/2016   High risk medication use 09/15/2016   Nocturnal leg cramps 09/15/2016   Old MI (myocardial infarction) 09/09/2016   Chronic systolic CHF (congestive heart failure), NYHA class 2 (HCC) 09/09/2016   PVC (premature ventricular contraction) 02/27/2016   Mixed hyperlipidemia 02/27/2016   Essential hypertension 02/27/2016   Chronic combined systolic and diastolic heart failure (HCC) 05/20/2015   Diabetes, polyneuropathy (HCC) 04/24/2015   BPH (benign prostatic hyperplasia) 04/23/2015   Cardiac arrhythmia 04/23/2015   Cervical disc disorder at C5-C6 level with radiculopathy 04/23/2015   Chronic obstructive pulmonary disease (HCC) 04/23/2015   Encounter for long-term (current) use of high-risk medication 04/23/2015   Encounter for screening for malignant neoplasm 04/23/2015   Fatty liver 04/23/2015   Gastro-esophageal reflux 04/23/2015   Hyperlipidemia 04/23/2015   Implanted defibrillator electrode lead fracture 04/23/2015   Insomnia 04/23/2015   Lung nodule 04/23/2015   Osteoarthritis of knee 04/23/2015   Peripheral neuropathy 04/23/2015   Vitamin D  deficiency 04/23/2015   Colon polyp 04/23/2015   Chronic systolic (congestive) heart failure (HCC) 04/23/2015   Osteoarthritis 04/23/2015   COPD (chronic obstructive pulmonary disease) (HCC) 04/23/2015   Cervical disc disease 04/23/2015   AICD present, double chamber 04/23/2015   Cardiomyopathy (HCC) 12/12/2014   Ischemic cardiomyopathy 04/16/2014   History of sudden cardiac arrest 04/16/2014   Atherosclerotic heart disease of native coronary artery without angina pectoris 04/16/2014   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 04/16/2014   Dyslipidemia 04/16/2014   Hypertensive heart disease with heart failure (HCC) 04/16/2014   Class 1 obesity due to excess calories with serious comorbidity and body mass index (BMI) of 31.0 to 31.9 in adult 04/16/2014   Obstructive sleep apnea syndrome 04/16/2014   S/P implantation of automatic cardioverter/defibrillator (AICD) 04/16/2014   OSA on CPAP 04/16/2014   Obesity (BMI 30.0-34.9) 04/16/2014   Coronary artery disease involving native coronary artery of native heart with angina pectoris (HCC) 04/16/2014   Cardiomyopathy, ischemic 04/16/2014   CAD (coronary artery disease) 04/16/2014   Implantable cardioverter-defibrillator (ICD) in situ 11/21/2007   Combined systolic and diastolic congestive heart failure (HCC) 03/23/2007    Past Surgical History:  Procedure Laterality Date   CARDIAC CATHETERIZATION     INSERT / REPLACE / REMOVE PACEMAKER  Biotronik AICD   KNEE SURGERY     UMBILICAL HERNIA REPAIR         Family History  Problem Relation Age of Onset   Lung cancer Father    Hypertension Father    Heart attack Maternal Grandfather     Social History   Tobacco Use   Smoking status: Former    Years: 30.00    Pack years: 0.00    Types: Cigarettes   Smokeless tobacco: Never  Vaping Use   Vaping Use: Never used  Substance Use Topics   Alcohol use: Yes    Alcohol/week: 1.0 standard drink    Types: 1 Glasses  of wine per week   Drug use: No    Home Medications Prior to Admission medications   Medication Sig Start Date End Date Taking? Authorizing Provider  albuterol (VENTOLIN HFA) 108 (90 Base) MCG/ACT inhaler Inhale 2 puffs into the lungs every 6 (six) hours as needed for wheezing or shortness of breath.   Yes [provider]  aspirin EC 81 MG tablet Take 1 tablet (81 mg total) by mouth daily. 08/26/17  Yes Baldo Daub, MD  budesonide-formoterol [facility]) 160-4.5 MCG/ACT inhaler Inhale 2 puffs into the lungs in the morning and at bedtime.   Yes [provider]  carboxymethylcellulose (REFRESH PLUS) 0.5 % SOLN Place 1 drop into both eyes as needed (dry eyes). 02/19/20  Yes [provider]  carvedilol (COREG) 12.5 MG tablet Take 1 tablet (12.5 mg total) by mouth 2 (two) times daily with a meal. 05/15/20  Yes Baldo Daub, MD  Cholecalciferol 25 MCG (1000 UT) capsule Take 1,000 Units by mouth daily.    Yes [provider]  diclofenac Sodium (VOLTAREN) 1 % GEL Apply 4 g topically 4 (four) times daily as needed for pain. 01/24/20  Yes [provider]  fluticasone-salmeterol (ADVAIR) 250-50 MCG/ACT AEPB Take 1 puff by mouth in the morning and at bedtime. 02/21/20  Yes [provider]  gabapentin (NEURONTIN) 300 MG capsule Take 600 mg by mouth 3 (three) times daily. 02/21/20  Yes [provider]  metFORMIN (GLUCOPHAGE) 500 MG tablet Take 500 mg by mouth 2 (two) times daily with a meal.  07/23/15  Yes [provider]  methimazole (TAPAZOLE) 10 MG tablet Take 20 mg by mouth 2 (two) times daily. 09/17/20  Yes [provider]  methocarbamol (ROBAXIN) 500 MG tablet Take 1,000 mg by mouth 2 (two) times daily.   Yes [provider]  Methylcellulose, Laxative, (CITRUCEL PO) Take 1 Scoop by mouth daily as needed (constipation).   Yes [provider]  Multiple Vitamin (MULTIVITAMIN) tablet Take 1 tablet by mouth daily.    Yes [provider]  nitroGLYCERIN (NITROSTAT) 0.4 MG SL tablet DISSOLVE 1 TABLET UNDER THE TONGUE EVERY 5 MINUTES AS NEEDED FOR CHEST PAIN. Patient taking differently: Place 0.4 mg under the tongue every 5 (five) minutes as needed for chest pain. 08/23/19  Yes Baldo Daub, MD  Omega-3 Fatty Acids (FISH OIL) 1200 MG CPDR Take 1,200 mg by mouth 3 (three) times daily.   Yes [provider]  omeprazole (PRILOSEC) 20 MG capsule Take 20 mg by mouth 2 (two) times daily. 03/17/16  Yes [provider]  ondansetron (ZOFRAN) 4 MG tablet Take 4 mg by mouth every 8 (eight) hours as needed. 09/23/20  Yes [provider]  polyethylene glycol powder (GLYCOLAX/MIRALAX) 17 GM/SCOOP powder Take 17 g by mouth daily as needed for mild constipation.  Yes [provider]  potassium chloride SA (KLOR-CON M20) 20 MEQ tablet Take 2 tablets (40 mEq total) by mouth daily. 09/24/20 12/23/20 Yes Baldo Daub, MD  pravastatin (PRAVACHOL) 40 MG tablet Take 1 tablet (40 mg total) by mouth daily. 05/15/20  Yes Baldo Daub, MD  promethazine (PHENERGAN) 25 MG suppository Place 1 suppository (25 mg total) rectally every 6 (six) hours as needed for nausea or vomiting. 09/27/20  Yes Wynetta Fines, MD  promethazine (PHENERGAN) 25 MG tablet Take 1 tablet (25 mg total) by mouth every 6 (six) hours as needed for nausea or vomiting. 09/27/20  Yes Vira Chaplin, Noralyn Pick, MD  sacubitril-valsartan (ENTRESTO) 24-26 MG Take 1 tablet by mouth 2 (two) times daily. 05/15/20  Yes Baldo Daub, MD  torsemide (DEMADEX) 100 MG tablet Take 0.5 tablets (50 mg total) by mouth 2 (two) times daily. 08/22/20  Yes Baldo Daub, MD  amiodarone (PACERONE) 200 MG tablet Take 1 tablet (200 mg total) by mouth daily. Patient not taking: No sig reported 05/15/20   Baldo Daub, MD  predniSONE (DELTASONE) 20 MG tablet Take 40 mg by mouth daily. 09/17/20   [provider]    Allergies    Patient has no known  allergies.  Review of Systems   Review of Systems  All other systems reviewed and are negative.  Physical Exam Updated Vital Signs BP (!) 110/55   Pulse 93   Temp 98.6 F (37 C)   Resp 11   SpO2 99%   Physical Exam Vitals and nursing note reviewed.  Constitutional:      General: He is not in acute distress.    Appearance: Normal appearance. He is well-developed.  HENT:     Head: Normocephalic and atraumatic.  Eyes:     Conjunctiva/sclera: Conjunctivae normal.     Pupils: Pupils are equal, round, and reactive to light.  Cardiovascular:     Rate and Rhythm: Normal rate and regular rhythm.     Heart sounds: Normal heart sounds.  Pulmonary:     Effort: Pulmonary effort is normal. No respiratory distress.     Breath sounds: Normal breath sounds.  Abdominal:     General: There is no distension.     Palpations: Abdomen is soft.     Tenderness: There is no abdominal tenderness.  Musculoskeletal:        General: No deformity. Normal range of motion.     Cervical back: Normal range of motion and neck supple.  Skin:    General: Skin is warm and dry.  Neurological:     General: No focal deficit present.     Mental Status: He is alert and oriented to person, place, and time.    ED Results / Procedures / Treatments   Labs (all labs ordered are listed, but only abnormal results are displayed) Labs Reviewed  COMPREHENSIVE METABOLIC PANEL - Abnormal; Notable for the following components:      Result Value   Chloride 96 (*)    Glucose, Bld 132 (*)    ALT 56 (*)    All other components within normal limits  LIPASE, BLOOD  CBC  URINALYSIS, ROUTINE W REFLEX MICROSCOPIC    EKG None  Radiology CT ABDOMEN PELVIS W CONTRAST  Result Date: 09/27/2020 CLINICAL DATA:  Acute abdominal pain for several days with nausea and vomiting EXAM: CT ABDOMEN AND PELVIS WITH CONTRAST TECHNIQUE: Multidetector CT imaging of the abdomen and pelvis was performed using the standard protocol  following bolus administration of intravenous contrast. CONTRAST:  OMNIPAQUE IOHEXOL 300 MG/ML  SOLN COMPARISON:  09/20/2020 FINDINGS: Lower chest: No acute abnormality. Hepatobiliary: Fatty infiltration of the liver is noted. The gallbladder is within normal limits. Tiny calcified granuloma is noted in the left lobe of the liver stable from the prior exam. Pancreas: Unremarkable. No pancreatic ductal dilatation or surrounding inflammatory changes. Spleen: Normal in size without focal abnormality. Adrenals/Urinary Tract: Adrenal glands are within normal limits. Kidneys demonstrate a normal enhancement pattern bilaterally. No definitive renal calculi are seen. Tiny right renal cyst is noted. No obstructive changes are seen. The bladder is well distended. Stomach/Bowel: Minimal diverticular change of the colon is noted. No evidence of diverticulitis is seen. The appendix is not well visualized. No inflammatory changes to suggest appendicitis are noted. Small bowel and stomach are within normal limits. Vascular/Lymphatic: Aortic atherosclerosis. No enlarged abdominal or pelvic lymph nodes. Reproductive: Prostate is unremarkable. Other: Fat containing left inguinal hernia is noted. Small fat containing umbilical hernia is noted as well. No ascites is seen. Musculoskeletal: No acute or significant osseous findings. IMPRESSION: Previously seen abnormality in the pancreas is not borne out on today's exam. This was likely related to the scatter artifact from contrast material in the stomach. Fatty liver. Diverticular change without diverticulitis. Electronically Signed   By: Alcide Clever M.D.   On: 09/27/2020 22:36    Procedures Procedures   Medications Ordered in ED Medications  sodium chloride 0.9 % bolus 500 mL (0 mLs Intravenous Stopped 09/27/20 2245)  ondansetron (ZOFRAN) injection 4 mg (4 mg Intravenous Given 09/27/20 2103)  iohexol (OMNIPAQUE) 300 MG/ML solution 100 mL (100 mLs Intravenous Contrast Given  09/27/20 2210)    ED Course  I have reviewed the triage vital signs and the nursing notes.  Pertinent labs & imaging results that were available during my care of the patient were reviewed by me and considered in my medical decision making (see chart for details).    MDM Rules/Calculators/A&P                          MDM  MSE complete  Austin Bush was evaluated in Emergency Department on 09/27/2020 for the symptoms described in the history of present illness. He was evaluated in the context of the global COVID-19 pandemic, which necessitated consideration that the patient might be at risk for infection with the SARS-CoV-2 virus that causes COVID-19. Institutional protocols and algorithms that pertain to the evaluation of patients at risk for COVID-19 are in a state of rapid change based on information released by regulatory bodies including the CDC and federal and state organizations. These policies and algorithms were followed during the patient's care in the ED.  Patient is presenting with complaint of persistent nausea, vomiting, and abdominal discomfort.  Complains of an ongoing for several weeks.  Work-up in the ED is without significant acute abnormality.  CT imaging does not reveal evidence of significant acute pathology.  He does feel improved following his ED evaluation.  He reports decreased nausea.  He is taking p.o. well at time of discharge.  He desires discharge home.  He does understand need for close follow-up.  Strict return precautions given and understood.   Final Clinical Impression(s) / ED Diagnoses Final diagnoses:  Nausea    Rx / DC Orders ED Discharge Orders          Ordered    promethazine (PHENERGAN) 25 MG suppository  Every 6  hours PRN        09/27/20 2314    promethazine (PHENERGAN) 25 MG tablet  Every 6 hours PRN        09/27/20 2314             Wynetta Fines, MD 09/27/20 2317

## 2020-09-29 ENCOUNTER — Emergency Department (HOSPITAL_COMMUNITY)
Admission: EM | Admit: 2020-09-29 | Discharge: 2020-09-29 | Disposition: A | Discharge status: Home / Self Care | Payer: Medicare Other | Attending: Emergency Medicine | Admitting: Emergency Medicine

## 2020-09-29 DIAGNOSIS — Z5321 Procedure and treatment not carried out due to patient leaving prior to being seen by health care provider: Secondary | ICD-10-CM | POA: Diagnosis not present

## 2020-09-29 NOTE — ED Notes (Signed)
Called pt x3 for triage, no answer. Told by [facility] employee he had left. Moving OTF.

## 2020-12-01 NOTE — Progress Notes (Signed)
Cardiology Office Note:    Date:  12/02/2020   ID:  Austin Bush, DOB [birth year], MRN [id number]  PCP:  Gordan Payment., MD  Cardiologist:  Norman Herrlich, MD    Referring MD: Gordan Payment., MD    ASSESSMENT:    1. Coronary artery disease involving native coronary artery of native heart with angina pectoris (HCC)   2. Ischemic cardiomyopathy   3. Hypertensive heart disease with heart failure (HCC)   4. VT (ventricular tachycardia) (HCC)   5. AICD present, double chamber   6. Hyperlipidemia, unspecified hyperlipidemia type   7. Hyperthyroidism    PLAN:    In order of problems listed above:  Stable CAD having no anginal discomfort continue treatment aspirin beta-blocker and lipid-lowering. Stable no evidence of heart failure continue his guideline directed therapy including Entresto maximally tolerated dose with a soft blood pressure his carvedilol he has a loop diuretic.  Heart failure is compensated. Stable he is now followed for his VT antiarrhythmic drug therapy and device in the [facility] cardiology clinic. Stable continue with statin Presently off suppressive treatment continued on steroids related   Next appointment: 9 months    Medication Adjustments/Labs and Tests Ordered: Current medicines are reviewed at length with the patient today.  Concerns regarding medicines are outlined above.  Orders Placed This Encounter  Procedures   EKG 12-Lead   No orders of the defined types were placed in this encounter.   Chief Complaint  Patient presents with   Follow-up    On amiodarone   Coronary Artery Disease   Congestive Heart Failure    History of Present Illness:    Austin Bush is a 65 y.o. male with a hx of CAD with VF arrest successfully resuscitated he has an ICD PVCs heart failure with EF of 40 to 45% April 2021 dyslipidemia and hypertension on amiodarone for VT recurrence last seen 08/22/2020.  At that visit he had marked fluid overload diuretic resistance and  was transition from furosemide to torsemide. Compliance with diet, lifestyle and medications:   A CT of chest without contrast 08/12/2020 showing no acute abnormality and unchanged 7 mm right lower lobe subpleural nodules in comparison to 2015 unchanged Most recent labs 09/30/2020 [facility] PCP: Serum sodium 134 potassium 4.0 creatinine 0.92 normal liver function test his TSH was quite low less than 0.05 GFR greater than 90 cc Hemoglobin 14.0 platelets 279,000 06/23/2020 cholesterol 126 LDL 61 triglycerides 89 HDL 57  Fortunately Gawain is able to fill in the blanks he has been going to the [place] hospital initially to access [place] due to problems subsequently 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  He told me has had no recurrent ventricular tachycardia. From cardiology perspective doing well having no edema shortness of breath orthopnea chest pain palpitation or syncope. Past Medical History:  Diagnosis Date   AICD present, double chamber 04/23/2015   Anemia of unknown etiology 09/17/2016   Atherosclerotic heart disease of native coronary artery without angina pectoris 04/16/2014   PCI to the LAD  Overview:  a. Cardiac cath 02/03/2012 Severe progression of CAD, albeit with patent stents in the proximal mid LAD and mid to distal circumflex with prior intervention in 2009. Now featuring high-grade ulcerative stenosis at the junction of the proximal and mid RCA, high-grade stenosis at the origin of posterior descending branch involving the very distal portion of the RCA proxima   Bilateral  sensorineural hearing loss 07/13/2019   BPH (benign prostatic hyperplasia) 04/23/2015   CAD (coronary artery disease) 04/16/2014   PCI to the LAD    Cardiac arrhythmia 04/23/2015   Cardiomyopathy (HCC) 12/12/2014   Cardiomyopathy, ischemic 04/16/2014   EF 25%    Cervical disc disease 04/23/2015   Cervical  disc disorder at C5-C6 level with radiculopathy 04/23/2015   Chronic combined systolic and diastolic heart failure (HCC) 05/20/2015   Chronic obstructive pulmonary disease (HCC) 04/23/2015   Chronic systolic (congestive) heart failure (HCC) 04/23/2015   Formatting of this note might be different from the original. EF 25-30%   Chronic systolic CHF (congestive heart failure), NYHA class 2 (HCC) 09/09/2016   Colon polyp 04/23/2015   Combined systolic and diastolic congestive heart failure (HCC) 03/23/2007   Formatting of this note might be different from the original. a. NYHA Class II-IIII  b. ACC/AHA Stage C c. Echo 8/13.2013 TDS. LV chamber size is normal. Global LV wall motion and contractility are within normal limits. Estimated EF 55-60%. RV is mildly dilated. RV global systolic function is mildly reduced. Trace MR. Trace TR d. Echo 10/31/2014 LA 4.42cm. EF 25-30%. LV chamber size is mildly dila   Congestive heart failure with left ventricular systolic dysfunction (HCC) 07/13/2019   COPD (chronic obstructive pulmonary disease) (HCC) 04/23/2015   Coronary artery disease involving native coronary artery of native heart with angina pectoris (HCC) 04/16/2014   PCI to the LAD  Overview:  a. Cardiac cath 02/03/2012 Severe progression of CAD, albeit with patent stents in the proximal mid LAD and mid to distal circumflex with prior intervention in 2009. Now featuring high-grade ulcerative stenosis at the junction of the proximal and mid RCA, high-grade stenosis at the origin of posterior descending branch involving the very distal portion of the RCA proximal to the bifurcation. 90% stenosis in the mid circumflex above the previously placed stent and 98% stenosis and a large very high takeoff diagonal branch of LAD coronary artery. Very distal LAD apical disease. b. reported catheterization 06/2015 without disease requiring intervention  Overview:  inferoposterior MI June 2009; 12 Sep 2009 with out of hospital SCD, hypothermia  and PCI at [facility]  Cardiac cath 07/28/15: Conclusions Diagnostic Procedure Summary Mild stenosis of the Circumflex artery Otherwise no obstructive coronary artery disease. Segmental LV dysfunction of inferior wall -moderate hypokinesis    Diabetes, polyneuropathy (HCC) 04/24/2015   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 04/16/2014   Biotronik - history of inappropriate shocks, at least 2 lead replacements   Dyslipidemia 04/16/2014   Encounter for long-term (current) use of high-risk medication 04/23/2015   Encounter for screening for malignant neoplasm 04/23/2015   Essential hypertension 04/16/2014   Fatty liver 04/23/2015   Gastro-esophageal reflux 04/23/2015   Gastroesophageal reflux disease with esophagitis 07/13/2019   Generalized abdominal pain 11/10/2017   Heart disease 07/13/2019   High risk medication use 09/15/2016   History of colonoscopy 07/13/2019   Formatting of this note might be different from the original. Jul 01, 2017 Entered By: Purvis Sheffield A Comment: done 2018 repeat 2028   History of iron deficiency anemia 06/07/2019   History of sudden cardiac arrest 04/16/2014   08/2007    Hyperlipidemia 04/23/2015   Hypertensive heart disease with heart failure (HCC) 04/16/2014   Implanted defibrillator electrode lead fracture 04/23/2015   Insomnia 04/23/2015   Iron deficiency anemia due to chronic blood loss 09/17/2016   Ischemic cardiomyopathy 04/16/2014   EF 25%  Overview:  a. EF of 30% after  myocardial infarction and cardiac arrest in 2009.  b. S/P dual-chamber ICD in 2009.  c. S/P ICD replacement and ICD generator change in 2012 due to lead malfunction resulting and 32 ICD shocks.  d. Echo 8/13.2013 TDS. LV chamber size is normal. Global LV wall motion and contractility are within normal limits. Estimated EF 55-60%. RV is mildly dilated. RV global systolic function is mildly reduced. Trace MR. Trace TR.  e. S/P system explant from the left with reimplantation on the right side February 1st  2016.  f. Echo 10/31/2014 LA 4.42cm. EF 25-30%. LV chamber size is mildly dilated. E:A reversal in the MV flow pattern suggestive of diastolic dysfunction. LA is mild to moderately dilated. RV is mildly dilated. RV global systolic function is mildly reduced. Mild AR, MR, TR, trace PR. Trivial pericardial effusion is visualized IVC is dilated.   Lateral epicondylitis 07/13/2019   Lung nodule 04/23/2015   Malaise and fatigue 11/10/2017   Mixed hyperlipidemia 02/27/2016   Overview:  a. Statin therapy.    Nocturnal leg cramps 09/15/2016   Obesity (BMI 30.0-34.9) 04/16/2014   Obstructive sleep apnea syndrome 04/16/2014   Obstructive sleep apnea treated with continuous positive airway pressure (CPAP) 07/13/2019   Old MI (myocardial infarction) 09/09/2016   OSA on CPAP 04/16/2014   Osteoarthritis 04/23/2015   Osteoarthritis of knee 04/23/2015   Peripheral neuropathy 04/23/2015   Primary osteoarthritis of both knees 07/13/2019   PVC (premature ventricular contraction) 02/27/2016   S/P implantation of automatic cardioverter/defibrillator (AICD) 04/16/2014   Biotronik - history of inappropriate shocks, at least 2 lead replacements    Steatosis of liver 07/13/2019   Strain of right trapezius muscle 07/13/2019   Type 2 diabetes mellitus (HCC) 07/13/2019   Type 2 diabetes mellitus with diabetic neuropathy, unspecified (HCC) 07/13/2019   Ventricular tachycardia (HCC) 06/15/2017   Vitamin D deficiency 04/23/2015    Past Surgical History:  Procedure Laterality Date   CARDIAC CATHETERIZATION     INSERT / REPLACE / REMOVE PACEMAKER     Biotronik AICD   KNEE SURGERY     UMBILICAL HERNIA REPAIR      Current Medications: Current Meds  Medication Sig   albuterol (VENTOLIN HFA) 108 (90 Base) MCG/ACT inhaler Inhale 2 puffs into the lungs every 6 (six) hours as needed for wheezing or shortness of breath.   aspirin EC 81 MG tablet Take 1 tablet (81 mg total) by mouth daily.   budesonide-formoterol (SYMBICORT) 160-4.5 MCG/ACT  inhaler Inhale 2 puffs into the lungs in the morning and at bedtime.   carboxymethylcellulose (REFRESH PLUS) 0.5 % SOLN Place 1 drop into both eyes as needed (dry eyes).   carvedilol (COREG) 12.5 MG tablet Take 1 tablet (12.5 mg total) by mouth 2 (two) times daily with a meal.   Cholecalciferol 25 MCG (1000 UT) capsule Take 1,000 Units by mouth daily.    diclofenac Sodium (VOLTAREN) 1 % GEL Apply 4 g topically 4 (four) times daily as needed for pain.   dicyclomine (BENTYL) 10 MG capsule Take 10 mg by mouth 3 (three) times daily.   ferrous sulfate 325 (65 FE) MG tablet Take 325 mg by mouth daily with breakfast.   gabapentin (NEURONTIN) 300 MG capsule Take 600 mg by mouth 3 (three) times daily.   HM LIDOCAINE PATCH EX Apply 1 % topically every 12 (twelve) hours as needed (Knee pain).   HYDROcodone-acetaminophen (NORCO/VICODIN) 5-325 MG tablet Take 1 tablet by mouth 3 (three) times daily as needed for pain.  metFORMIN (GLUCOPHAGE) 500 MG tablet Take 500 mg by mouth 2 (two) times daily with a meal.    Methylcellulose, Laxative, (CITRUCEL PO) Take 1 Scoop by mouth daily as needed (constipation).   Multiple Vitamin (MULTIVITAMIN) tablet Take 1 tablet by mouth daily.   nitroGLYCERIN (NITROSTAT) 0.4 MG SL tablet DISSOLVE 1 TABLET UNDER THE TONGUE EVERY 5 MINUTES AS NEEDED FOR CHEST PAIN. (Patient taking differently: Place 0.4 mg under the tongue every 5 (five) minutes as needed for chest pain.)   Omega-3 Fatty Acids (FISH OIL) 1200 MG CPDR Take 1,200 mg by mouth 3 (three) times daily.   omeprazole (PRILOSEC) 20 MG capsule Take 20 mg by mouth 2 (two) times daily.   polyethylene glycol powder (GLYCOLAX/MIRALAX) 17 GM/SCOOP powder Take 17 g by mouth daily as needed for mild constipation.   potassium chloride SA (KLOR-CON M20) 20 MEQ tablet Take 2 tablets (40 mEq total) by mouth daily.   pravastatin (PRAVACHOL) 40 MG tablet Take 1 tablet (40 mg total) by mouth daily.   predniSONE (DELTASONE) 20 MG tablet  Take 20 mg by mouth daily.   sacubitril-valsartan (ENTRESTO) 24-26 MG Take 1 tablet by mouth 2 (two) times daily.   sucralfate (CARAFATE) 1 g tablet Take 1 g by mouth 4 (four) times daily.   torsemide (DEMADEX) 100 MG tablet Take 0.5 tablets (50 mg total) by mouth 2 (two) times daily.     Allergies:   Amiodarone   Social History   Socioeconomic History   Marital status: Married    Spouse name: Not on file   Number of children: Not on file   Years of education: Not on file   Highest education level: Not on file  Occupational History   Not on file  Tobacco Use   Smoking status: Former    Years: 30.00    Types: Cigarettes   Smokeless tobacco: Never  Vaping Use   Vaping Use: Never used  Substance and Sexual Activity   Alcohol use: Yes    Alcohol/week: 1.0 standard drink    Types: 1 Glasses of wine per week   Drug use: No   Sexual activity: Not on file  Other Topics Concern   Not on file  Social History Narrative   Not on file   Social Determinants of Health   Financial Resource Strain: Not on file  Food Insecurity: Not on file  Transportation Needs: Not on file  Physical Activity: Not on file  Stress: Not on file  Social Connections: Not on file     Family History: The patient's family history includes Heart attack in his maternal grandfather; Hypertension in his father; Lung cancer in his father. ROS:   Please see the history of present illness.    All other systems reviewed and are negative.  EKGs/Labs/Other Studies Reviewed:    The following studies were reviewed today:  EKG:  EKG ordered today and personally reviewed.  The ekg ordered today demonstrates sinus rhythm normal EKG  Recent Labs: 09/27/2020: ALT 56; BUN 13; Creatinine, Ser 0.97; Hemoglobin 15.3; Platelets 296; Potassium 3.5; Sodium 136  Recent Lipid Panel    Component Value Date/Time   CHOL 182 10/07/2017 0911   TRIG 81 10/07/2017 0911   HDL 64 10/07/2017 0911   CHOLHDL 2.8 10/07/2017 0911    LDLCALC 102 (H) 10/07/2017 0911    Physical Exam:    VS:  BP 108/68 (BP Location: Right Arm, Patient Position: Sitting)   Pulse 81   Ht 6' (1.829 m)  Wt 228 lb 12.8 oz (103.8 kg)   SpO2 97%   BMI 31.03 kg/m     Wt Readings from Last 3 Encounters:  12/02/20 228 lb 12.8 oz (103.8 kg)  08/22/20 241 lb (109.3 kg)  07/16/19 235 lb (106.6 kg)     GEN:  Well nourished, well developed in no acute distress HEENT: Normal NECK: No JVD; No carotid bruits LYMPHATICS: No lymphadenopathy CARDIAC: RRR, no murmurs, rubs, gallops RESPIRATORY:  Clear to auscultation without rales, wheezing or rhonchi  ABDOMEN: Soft, non-tender, non-distended MUSCULOSKELETAL:  No edema; No deformity  SKIN: Warm and dry NEUROLOGIC:  Alert and oriented x 3 PSYCHIATRIC:  Normal affect    Signed, Norman HerrlichBrian Shellby Schlink, MD  12/02/2020 11:18 AM    [facility]

## 2020-12-02 ENCOUNTER — Encounter: Payer: Self-pay | Admitting: Cardiology

## 2020-12-02 ENCOUNTER — Other Ambulatory Visit: Payer: Self-pay

## 2020-12-02 ENCOUNTER — Ambulatory Visit: Payer: Medicare Other | Admitting: Cardiology

## 2020-12-02 VITALS — BP 108/68 | HR 81 | Ht 72.0 in | Wt 228.8 lb

## 2020-12-02 DIAGNOSIS — I110 Hypertensive heart disease with heart failure: Secondary | ICD-10-CM | POA: Diagnosis not present

## 2020-12-02 DIAGNOSIS — I25119 Atherosclerotic heart disease of native coronary artery with unspecified angina pectoris: Secondary | ICD-10-CM | POA: Diagnosis not present

## 2020-12-02 DIAGNOSIS — I472 Ventricular tachycardia, unspecified: Secondary | ICD-10-CM

## 2020-12-02 DIAGNOSIS — I255 Ischemic cardiomyopathy: Secondary | ICD-10-CM

## 2020-12-02 DIAGNOSIS — Z95810 Presence of automatic (implantable) cardiac defibrillator: Secondary | ICD-10-CM

## 2020-12-02 DIAGNOSIS — E785 Hyperlipidemia, unspecified: Secondary | ICD-10-CM

## 2020-12-02 DIAGNOSIS — E0590 Thyrotoxicosis, unspecified without thyrotoxic crisis or storm: Secondary | ICD-10-CM

## 2020-12-02 NOTE — Patient Instructions (Signed)

## 2020-12-06 ENCOUNTER — Other Ambulatory Visit: Payer: Self-pay | Admitting: Cardiology

## 2021-03-05 ENCOUNTER — Other Ambulatory Visit: Payer: Self-pay | Admitting: Cardiology

## 2021-10-01 ENCOUNTER — Ambulatory Visit (INDEPENDENT_AMBULATORY_CARE_PROVIDER_SITE_OTHER): Payer: Medicare HMO | Admitting: Cardiology

## 2021-10-01 ENCOUNTER — Encounter: Payer: Self-pay | Admitting: Cardiology

## 2021-10-01 VITALS — BP 100/60 | HR 78 | Ht 72.0 in | Wt 237.0 lb

## 2021-10-01 DIAGNOSIS — I472 Ventricular tachycardia, unspecified: Secondary | ICD-10-CM

## 2021-10-01 DIAGNOSIS — Z95810 Presence of automatic (implantable) cardiac defibrillator: Secondary | ICD-10-CM

## 2021-10-01 DIAGNOSIS — E785 Hyperlipidemia, unspecified: Secondary | ICD-10-CM

## 2021-10-01 DIAGNOSIS — I25119 Atherosclerotic heart disease of native coronary artery with unspecified angina pectoris: Secondary | ICD-10-CM

## 2021-10-01 DIAGNOSIS — I255 Ischemic cardiomyopathy: Secondary | ICD-10-CM

## 2021-10-01 DIAGNOSIS — I110 Hypertensive heart disease with heart failure: Secondary | ICD-10-CM

## 2021-10-01 MED ORDER — DAPAGLIFLOZIN PROPANEDIOL 10 MG PO TABS: 10.0000 mg | ORAL_TABLET | Freq: Every day | ORAL | 3 refills | Status: AC

## 2021-10-01 NOTE — Progress Notes (Signed)
Cardiology Office Note:    Date:  10/01/2021   ID:  Austin Bush, DOB [birth year], MRN [id number]  PCP:  Austin Payment., Bush  Cardiologist:  Austin Bush    Referring Bush: Austin Payment., Bush    ASSESSMENT:    1. Coronary artery disease involving native coronary artery of native heart with angina pectoris (HCC)   2. Ischemic cardiomyopathy   3. Hypertensive heart disease with heart failure (HCC)   4. VT (ventricular tachycardia) (HCC)   5. AICD present, double chamber   6. Hyperlipidemia, unspecified hyperlipidemia type    PLAN:    In order of problems listed above:  Austin Bush continues to do well with his complex heart disease including CAD he is having no angina on his current medical treatment we will continue his aspirin beta-blocker and lipid-lowering statin. Stable on good guideline directed therapy including his beta-blocker maximally tolerated tolerated Entresto and to optimize treatment we will add SGLT2 inhibitor with his diabetes and continue his loop diuretic he has no fluid overload No recurrent ventricular tachycardia or ICD delivered VT VF therapy He tolerates a low potency statin and continue pravastatin   Next appointment: 6 months   Medication Adjustments/Labs and Tests Ordered: Current medicines are reviewed at length with the patient today.  Concerns regarding medicines are outlined above.  No orders of the defined types were placed in this encounter.  No orders of the defined types were placed in this encounter.   Chief Complaint  Patient presents with   Follow-up   Congestive Heart Failure   Coronary Artery Disease   ventr tachyc   VT    History of Present Illness:    Austin Bush is a 66 y.o. male with a hx of CAD with VF arrest successfully resuscitated he has an ICD PVCs heart failure with EF of 40 to 45% April 2021 dyslipidemia and hypertension on amiodarone for VT recurrence and hypothyroidism  last seen 12/02/2020.  At the time of his  last visit his amiodarone has been discontinued due to hyperthyroidism.  He is receiving EP care and device follow-up through [facility].  He follows with pulmonary medicine [facility] for restrictive lung disease and COPD and obstructive sleep apnea.  Compliance with diet, lifestyle and medications: Yes  Austin Bush is a good state, he is not having edema orthopnea shortness of breath chest pain palpitations or syncope. He is pleased with the quality of his life His recent labs are reassuring 08/01/2021: Hemoglobin 13.3 Potassium 4.2 creatinine 1.15 Cholesterol 170 LDL 103 triglycerides 128 Past Medical History:  Diagnosis Date   AICD present, double chamber 04/23/2015   Anemia of unknown etiology 09/17/2016   Atherosclerotic heart disease of native coronary artery without angina pectoris 04/16/2014   PCI to the LAD  Overview:  a. Cardiac cath 02/03/2012 Severe progression of CAD, albeit with patent stents in the proximal mid LAD and mid to distal circumflex with prior intervention in 2009. Now featuring high-grade ulcerative stenosis at the junction of the proximal and mid RCA, high-grade stenosis at the origin of posterior descending branch involving the very distal portion of the RCA proxima   Bilateral sensorineural hearing loss 07/13/2019   BPH (benign prostatic hyperplasia) 04/23/2015   CAD (coronary artery disease) 04/16/2014   PCI to the LAD    Cardiac arrhythmia 04/23/2015   Cardiomyopathy (HCC) 12/12/2014   Cardiomyopathy, ischemic 04/16/2014   EF 25%    Cervical disc disease 04/23/2015   Cervical disc  disorder at C5-C6 level with radiculopathy 04/23/2015   Chronic combined systolic and diastolic heart failure ([place]) 05/20/2015   Chronic obstructive pulmonary disease ([place]) 0000000   Chronic systolic (congestive) heart failure ([place]) 04/23/2015   Formatting of this note might be different from the original. EF [id number]   Chronic systolic CHF (congestive heart failure), NYHA  class 2 ([place]) 09/09/2016   Colon polyp 04/23/2015   Combined systolic and diastolic congestive heart failure ([place]) 03/23/2007   Formatting of this note might be different from the original. a. NYHA Class II-IIII  b. ACC/AHA Stage C c. Echo 8/13.2013 TDS. LV chamber size is normal. Global LV wall motion and contractility are within normal limits. Estimated EF 55-60%. RV is mildly dilated. RV global systolic function is mildly reduced. Trace MR. Trace TR d. Echo 10/31/2014 LA 4.42cm. EF 25-30%. LV chamber size is mildly dila   Congestive heart failure with left ventricular systolic dysfunction ([place]) 07/13/2019   COPD (chronic obstructive pulmonary disease) ([place]) 04/23/2015   Coronary artery disease involving native coronary artery of native heart with angina pectoris ([place]) 04/16/2014   PCI to the LAD  Overview:  a. Cardiac cath 02/03/2012 Severe progression of CAD, albeit with patent stents in the proximal mid LAD and mid to distal circumflex with prior intervention in 2009. Now featuring high-grade ulcerative stenosis at the junction of the proximal and mid RCA, high-grade stenosis at the origin of posterior descending branch involving the very distal portion of the RCA proximal to the bifurcation. 90% stenosis in the mid circumflex above the previously placed stent and 98% stenosis and a large very high takeoff diagonal branch of LAD coronary artery. Very distal LAD apical disease. b. reported catheterization 06/2015 without disease requiring intervention  Overview:  inferoposterior MI June 2009; 12 Sep 2009 with out of hospital SCD, hypothermia and PCI at [facility]  Cardiac cath 07/28/15: Conclusions Diagnostic Procedure Summary Mild stenosis of the Circumflex artery Otherwise no obstructive coronary artery disease. Segmental LV dysfunction of inferior wall -moderate hypokinesis    Diabetes, polyneuropathy ([place]) 04/24/2015   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 04/16/2014   Biotronik - history of  inappropriate shocks, at least 2 lead replacements   Dyslipidemia 04/16/2014   Encounter for long-term (current) use of high-risk medication 04/23/2015   Encounter for screening for malignant neoplasm 04/23/2015   Essential hypertension 04/16/2014   Fatty liver 04/23/2015   Gastro-esophageal reflux 04/23/2015   Gastroesophageal reflux disease with esophagitis 07/13/2019   Generalized abdominal pain 11/10/2017   Heart disease 07/13/2019   High risk medication use 09/15/2016   History of colonoscopy 07/13/2019   Formatting of this note might be different from the original. Jul 01, 2017 Entered By: Sharmaine Base A Comment: done 2018 repeat 2028   History of iron deficiency anemia 06/07/2019   History of sudden cardiac arrest 04/16/2014   08/2007    Hyperlipidemia 04/23/2015   Hypertensive heart disease with heart failure ([place]) 04/16/2014   Implanted defibrillator electrode lead fracture 04/23/2015   Insomnia 04/23/2015   Iron deficiency anemia due to chronic blood loss 09/17/2016   Ischemic cardiomyopathy 04/16/2014   EF 25%  Overview:  a. EF of 30% after myocardial infarction and cardiac arrest in 2009.  b. S/P dual-chamber ICD in 2009.  c. S/P ICD replacement and ICD generator change in 2012 due to lead malfunction resulting and 32 ICD shocks.  d. Echo 8/13.2013 TDS. LV chamber size is normal. Global LV wall motion and contractility are within normal limits. Estimated  EF 55-60%. RV is mildly dilated. RV global systolic function is mildly reduced. Trace MR. Trace TR.  e. S/P system explant from the left with reimplantation on the right side February 1st 2016.  f. Echo 10/31/2014 LA 4.42cm. EF 25-30%. LV chamber size is mildly dilated. E:A reversal in the MV flow pattern suggestive of diastolic dysfunction. LA is mild to moderately dilated. RV is mildly dilated. RV global systolic function is mildly reduced. Mild AR, MR, TR, trace PR. Trivial pericardial effusion is visualized IVC is dilated.   Lateral epicondylitis  07/13/2019   Lung nodule 04/23/2015   Malaise and fatigue 11/10/2017   Mixed hyperlipidemia 02/27/2016   Overview:  a. Statin therapy.    Nocturnal leg cramps 09/15/2016   Obesity (BMI 30.0-34.9) 04/16/2014   Obstructive sleep apnea syndrome 04/16/2014   Obstructive sleep apnea treated with continuous positive airway pressure (CPAP) 07/13/2019   Old MI (myocardial infarction) 09/09/2016   OSA on CPAP 04/16/2014   Osteoarthritis 04/23/2015   Osteoarthritis of knee 04/23/2015   Peripheral neuropathy 04/23/2015   Primary osteoarthritis of both knees 07/13/2019   PVC (premature ventricular contraction) 02/27/2016   S/P implantation of automatic cardioverter/defibrillator (AICD) 04/16/2014   Biotronik - history of inappropriate shocks, at least 2 lead replacements    Steatosis of liver 07/13/2019   Strain of right trapezius muscle 07/13/2019   Type 2 diabetes mellitus ([place]) 07/13/2019   Type 2 diabetes mellitus with diabetic neuropathy, unspecified ([place]) 07/13/2019   Ventricular tachycardia ([place]) 06/15/2017   Vitamin D deficiency 04/23/2015    Past Surgical History:  Procedure Laterality Date   CARDIAC CATHETERIZATION     INSERT / REPLACE / REMOVE PACEMAKER     Biotronik AICD   KNEE SURGERY     UMBILICAL HERNIA REPAIR      Current Medications: Current Meds  Medication Sig   albuterol (VENTOLIN HFA) 108 (90 Base) MCG/ACT inhaler Inhale 2 puffs into the lungs every 6 (six) hours as needed for wheezing or shortness of breath.   aspirin EC 81 MG tablet Take 1 tablet (81 mg total) by mouth daily.   budesonide-formoterol (SYMBICORT) 160-4.5 MCG/ACT inhaler Inhale 2 puffs into the lungs in the morning and at bedtime.   carboxymethylcellulose (REFRESH PLUS) 0.5 % SOLN Place 1 drop into both eyes as needed (dry eyes).   carvedilol (COREG) 12.5 MG tablet Take 1 tablet (12.5 mg total) by mouth 2 (two) times daily with a meal.   Cholecalciferol 25 MCG (1000 UT) capsule Take 1,000 Units by mouth daily.    diclofenac  Sodium (VOLTAREN) 1 % GEL Apply 4 g topically 4 (four) times daily as needed for pain.   dicyclomine (BENTYL) 10 MG capsule Take 10 mg by mouth 3 (three) times daily.   Docusate Sodium (DSS) 100 MG CAPS Take 100 mg by mouth daily.   ferrous sulfate 325 (65 FE) MG tablet Take 325 mg by mouth daily with breakfast.   gabapentin (NEURONTIN) 300 MG capsule Take 600 mg by mouth 3 (three) times daily.   HM LIDOCAINE PATCH EX Apply 1 % topically every 12 (twelve) hours as needed (Knee pain).   HYDROcodone-acetaminophen (NORCO) 10-325 MG tablet Take 1 tablet by mouth every 8 (eight) hours as needed for pain.   metFORMIN (GLUCOPHAGE) 500 MG tablet Take 500 mg by mouth 2 (two) times daily with a meal.    Methylcellulose, Laxative, (CITRUCEL PO) Take 1 Scoop by mouth daily as needed (constipation).   Multiple Vitamin (MULTIVITAMIN) tablet Take 1  tablet by mouth daily.   nitroGLYCERIN (NITROSTAT) 0.4 MG SL tablet DISSOLVE 1 TABLET UNDER THE TONGUE EVERY 5 MINUTES AS NEEDED FOR CHEST PAIN. (Patient taking differently: Place 0.4 mg under the tongue every 5 (five) minutes as needed for chest pain.)   Omega-3 Fatty Acids (FISH OIL) 1200 MG CPDR Take 1,200 mg by mouth 3 (three) times daily.   omeprazole (PRILOSEC) 20 MG capsule Take 20 mg by mouth 2 (two) times daily.   polyethylene glycol powder (GLYCOLAX/MIRALAX) 17 GM/SCOOP powder Take 17 g by mouth daily as needed for mild constipation.   potassium chloride (KLOR-CON) 10 MEQ tablet Take 20 mEq by mouth 2 (two) times daily.   pravastatin (PRAVACHOL) 40 MG tablet Take 1 tablet (40 mg total) by mouth daily.   predniSONE (DELTASONE) 20 MG tablet Take 20 mg by mouth daily.   sacubitril-valsartan (ENTRESTO) 24-26 MG Take 1 tablet by mouth 2 (two) times daily.   sitaGLIPtin (JANUVIA) 25 MG tablet Take 1 tablet by mouth daily.   sucralfate (CARAFATE) 1 g tablet Take 1 g by mouth 4 (four) times daily.   torsemide (DEMADEX) 100 MG tablet Take 0.5 tablets (50 mg total)  by mouth 2 (two) times daily.     Allergies:   Amiodarone   Social History   Socioeconomic History   Marital status: Married    Spouse name: Not on file   Number of children: Not on file   Years of education: Not on file   Highest education level: Not on file  Occupational History   Not on file  Tobacco Use   Smoking status: Former    Years: 30.00    Types: Cigarettes   Smokeless tobacco: Never  Vaping Use   Vaping Use: Never used  Substance and Sexual Activity   Alcohol use: Yes    Alcohol/week: 1.0 standard drink of alcohol    Types: 1 Glasses of wine per week   Drug use: No   Sexual activity: Not on file  Other Topics Concern   Not on file  Social History Narrative   Not on file   Social Determinants of Health   Financial Resource Strain: Not on file  Food Insecurity: Not on file  Transportation Needs: Not on file  Physical Activity: Not on file  Stress: Not on file  Social Connections: Not on file     Family History: The patient's family history includes Heart attack in his maternal grandfather; Hypertension in his father; Lung cancer in his father. ROS:   Please see the history of present illness.    All other systems reviewed and are negative.  EKGs/Labs/Other Studies Reviewed:    The following studies were reviewed today:   Recent Labs: See history   Physical Exam:    VS:  BP 100/60 (BP Location: Left Arm, Patient Position: Sitting, Cuff Size: Normal)   Pulse 78   Ht 6' (1.829 m)   Wt 237 lb (107.5 kg)   SpO2 93%   BMI 32.14 kg/m     Wt Readings from Last 3 Encounters:  10/01/21 237 lb (107.5 kg)  12/02/20 228 lb 12.8 oz (103.8 kg)  08/22/20 241 lb (109.3 kg)     GEN:  Well nourished, well developed in no acute distress HEENT: Normal NECK: No JVD; No carotid bruits LYMPHATICS: No lymphadenopathy CARDIAC: RRR, no murmurs, rubs, gallops RESPIRATORY:  Clear to auscultation without rales, wheezing or rhonchi  ABDOMEN: Soft,  non-tender, non-distended MUSCULOSKELETAL:  No edema; No deformity  SKIN: Warm and dry NEUROLOGIC:  Alert and oriented x 3 PSYCHIATRIC:  Normal affect    Signed, Shirlee More, Bush  10/01/2021 3:30 PM    [place]

## 2021-10-01 NOTE — Patient Instructions (Signed)
Medication Instructions:  Your physician has recommended you make the following change in your medication:   START: Farxiga 10 mg daily  *If you need a refill on your cardiac medications before your next appointment, please call your pharmacy*   Lab Work: None If you have labs (blood work) drawn today and your tests are completely normal, you will receive your results only by: MyChart Message (if you have MyChart) OR A paper copy in the mail If you have any lab test that is abnormal or we need to change your treatment, we will call you to review the results.   Testing/Procedures: None   Follow-Up: At [facility], you and your health needs are our priority.  As part of our continuing mission to provide you with exceptional heart care, we have created designated Provider Care Teams.  These Care Teams include your primary Cardiologist (physician) and Advanced Practice Providers (APPs -  Physician Assistants and Nurse Practitioners) who all work together to provide you with the care you need, when you need it.  We recommend signing up for the patient portal called "MyChart".  Sign up information is provided on this After Visit Summary.  MyChart is used to connect with patients for Virtual Visits (Telemedicine).  Patients are able to view lab/test results, encounter notes, upcoming appointments, etc.  Non-urgent messages can be sent to your provider as well.   To learn more about what you can do with MyChart, go to [email or web address].    Your next appointment:   6 month(s)  The format for your next appointment:   In Person  Provider:   Norman Herrlich, MD    Other Instructions None  Important Information About Sugar

## 2022-05-31 NOTE — Progress Notes (Deleted)
Cardiology Office Note:    Date:  06/01/2022   ID:  Austin Bush, DOB July 12, 1955, MRN [id number]  PCP:  Raina Mina., MD  Cardiologist:  Shirlee More, MD    Referring MD: Raina Mina., MD    ASSESSMENT:    1. Coronary artery disease involving native coronary artery of native heart with angina pectoris ([place])   2. Hypertensive heart disease with heart failure ([place])   3. Ischemic cardiomyopathy   4. AICD present, double chamber   5. VT (ventricular tachycardia) ([place])   6. On amiodarone therapy   7. Hyperlipidemia, unspecified hyperlipidemia type   8. Chronic obstructive pulmonary disease, unspecified COPD type ([place])    PLAN:    In order of problems listed above:  ***   Next appointment: ***   Medication Adjustments/Labs and Tests Ordered: Current medicines are reviewed at length with the patient today.  Concerns regarding medicines are outlined above.  No orders of the defined types were placed in this encounter.  No orders of the defined types were placed in this encounter.   No chief complaint on file.   History of Present Illness:    Austin Bush is a 67 y.o. male with a hx of complex heart disease including CAD with initial VF arrest out of hospital and successfully resuscitated he has an ICD heart failure with EF 40 to 45% in April 2021 dyslipidemia hypertensive heart disease on amiodarone for recurrent ventricular tachycardia and subsequent hypothyroidism last seen 10/01/2021. Compliance with diet, lifestyle and medications: *** Past Medical History:  Diagnosis Date   AICD present, double chamber 04/23/2015   Anemia of unknown etiology 09/17/2016   Atherosclerotic heart disease of native coronary artery without angina pectoris 04/16/2014   PCI to the LAD  Overview:  a. Cardiac cath 02/03/2012 Severe progression of CAD, albeit with patent stents in the proximal mid LAD and mid to distal circumflex with prior intervention in 2009. Now featuring high-grade  ulcerative stenosis at the junction of the proximal and mid RCA, high-grade stenosis at the origin of posterior descending branch involving the very distal portion of the RCA proxima   Bilateral sensorineural hearing loss 07/13/2019   BPH (benign prostatic hyperplasia) 04/23/2015   CAD (coronary artery disease) 04/16/2014   PCI to the LAD    Cardiac arrhythmia 04/23/2015   Cardiomyopathy ([place]) 12/12/2014   Cardiomyopathy, ischemic 04/16/2014   EF 25%    Cervical disc disease 04/23/2015   Cervical disc disorder at C5-C6 level with radiculopathy 04/23/2015   Chronic combined systolic and diastolic heart failure ([place]) 05/20/2015   Chronic obstructive pulmonary disease ([place]) 0000000   Chronic systolic (congestive) heart failure ([place]) 04/23/2015   Formatting of this note might be different from the original. EF [id number]   Chronic systolic CHF (congestive heart failure), NYHA class 2 ([place]) 09/09/2016   Colon polyp 04/23/2015   Combined systolic and diastolic congestive heart failure ([place]) 03/23/2007   Formatting of this note might be different from the original. a. NYHA Class II-IIII  b. ACC/AHA Stage C c. Echo 8/13.2013 TDS. LV chamber size is normal. Global LV wall motion and contractility are within normal limits. Estimated EF 55-60%. RV is mildly dilated. RV global systolic function is mildly reduced. Trace MR. Trace TR d. Echo 10/31/2014 LA 4.42cm. EF 25-30%. LV chamber size is mildly dila   Congestive heart failure with left ventricular systolic dysfunction ([place]) 07/13/2019   COPD (chronic obstructive pulmonary disease) ([place]) 04/23/2015   Coronary artery disease involving native  coronary artery of native heart with angina pectoris ([place]) 04/16/2014   PCI to the LAD  Overview:  a. Cardiac cath 02/03/2012 Severe progression of CAD, albeit with patent stents in the proximal mid LAD and mid to distal circumflex with prior intervention in 2009. Now featuring high-grade ulcerative stenosis at the junction of the proximal and  mid RCA, high-grade stenosis at the origin of posterior descending branch involving the very distal portion of the RCA proximal to the bifurcation. 90% stenosis in the mid circumflex above the previously placed stent and 98% stenosis and a large very high takeoff diagonal branch of LAD coronary artery. Very distal LAD apical disease. b. reported catheterization 06/2015 without disease requiring intervention  Overview:  inferoposterior MI June 2009; 12 Sep 2009 with out of hospital SCD, hypothermia and PCI at [facility]  Cardiac cath 07/28/15: Conclusions Diagnostic Procedure Summary Mild stenosis of the Circumflex artery Otherwise no obstructive coronary artery disease. Segmental LV dysfunction of inferior wall -moderate hypokinesis    Diabetes, polyneuropathy ([place]) 04/24/2015   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 04/16/2014   Biotronik - history of inappropriate shocks, at least 2 lead replacements   Dyslipidemia 04/16/2014   Encounter for long-term (current) use of high-risk medication 04/23/2015   Encounter for screening for malignant neoplasm 04/23/2015   Essential hypertension 04/16/2014   Fatty liver 04/23/2015   Gastro-esophageal reflux 04/23/2015   Gastroesophageal reflux disease with esophagitis 07/13/2019   Generalized abdominal pain 11/10/2017   Heart disease 07/13/2019   High risk medication use 09/15/2016   History of colonoscopy 07/13/2019   Formatting of this note might be different from the original. Jul 01, 2017 Entered By: Sharmaine Base A Comment: done 2018 repeat 2028   History of iron deficiency anemia 06/07/2019   History of sudden cardiac arrest 04/16/2014   08/2007    Hyperlipidemia 04/23/2015   Hypertensive heart disease with heart failure ([place]) 04/16/2014   Implanted defibrillator electrode lead fracture 04/23/2015   Insomnia 04/23/2015   Iron deficiency anemia due to chronic blood loss 09/17/2016   Ischemic cardiomyopathy 04/16/2014   EF 25%  Overview:  a. EF of 30% after  myocardial infarction and cardiac arrest in 2009.  b. S/P dual-chamber ICD in 2009.  c. S/P ICD replacement and ICD generator change in 2012 due to lead malfunction resulting and 32 ICD shocks.  d. Echo 8/13.2013 TDS. LV chamber size is normal. Global LV wall motion and contractility are within normal limits. Estimated EF 55-60%. RV is mildly dilated. RV global systolic function is mildly reduced. Trace MR. Trace TR.  e. S/P system explant from the left with reimplantation on the right side February 1st 2016.  f. Echo 10/31/2014 LA 4.42cm. EF 25-30%. LV chamber size is mildly dilated. E:A reversal in the MV flow pattern suggestive of diastolic dysfunction. LA is mild to moderately dilated. RV is mildly dilated. RV global systolic function is mildly reduced. Mild AR, MR, TR, trace PR. Trivial pericardial effusion is visualized IVC is dilated.   Lateral epicondylitis 07/13/2019   Lung nodule 04/23/2015   Malaise and fatigue 11/10/2017   Mixed hyperlipidemia 02/27/2016   Overview:  a. Statin therapy.    Nocturnal leg cramps 09/15/2016   Obesity (BMI 30.0-34.9) 04/16/2014   Obstructive sleep apnea syndrome 04/16/2014   Obstructive sleep apnea treated with continuous positive airway pressure (CPAP) 07/13/2019   Old MI (myocardial infarction) 09/09/2016   OSA on CPAP 04/16/2014   Osteoarthritis 04/23/2015   Osteoarthritis of knee 04/23/2015   Peripheral  neuropathy 04/23/2015   Primary osteoarthritis of both knees 07/13/2019   PVC (premature ventricular contraction) 02/27/2016   S/P implantation of automatic cardioverter/defibrillator (AICD) 04/16/2014   Biotronik - history of inappropriate shocks, at least 2 lead replacements    Steatosis of liver 07/13/2019   Strain of right trapezius muscle 07/13/2019   Type 2 diabetes mellitus ([place]) 07/13/2019   Type 2 diabetes mellitus with diabetic neuropathy, unspecified ([place]) 07/13/2019   Ventricular tachycardia ([place]) 06/15/2017   Vitamin D deficiency 04/23/2015    Past Surgical  History:  Procedure Laterality Date   CARDIAC CATHETERIZATION     INSERT / REPLACE / [place]      Current Medications: No outpatient medications have been marked as taking for the 06/01/22 encounter (Appointment) with Richardo Priest, MD.     Allergies:   Amiodarone   Social History   Socioeconomic History   Marital status: Married    Spouse name: Not on file   Number of children: Not on file   Years of education: Not on file   Highest education level: Not on file  Occupational History   Not on file  Tobacco Use   Smoking status: Former    Years: 30.00    Types: Cigarettes   Smokeless tobacco: Never  Vaping Use   Vaping Use: Never used  Substance and Sexual Activity   Alcohol use: Yes    Alcohol/week: 1.0 standard drink of alcohol    Types: 1 Glasses of wine per week   Drug use: No   Sexual activity: Not on file  Other Topics Concern   Not on file  Social History Narrative   Not on file   Social Determinants of Health   Financial Resource Strain: Not on file  Food Insecurity: Not on file  Transportation Needs: Not on file  Physical Activity: Not on file  Stress: Not on file  Social Connections: Not on file     Family History: The patient's ***family history includes Heart attack in his maternal grandfather; Hypertension in his father; Lung cancer in his father. ROS:   Please see the history of present illness.    All other systems reviewed and are negative.  EKGs/Labs/Other Studies Reviewed:    The following studies were reviewed today:  Cardiac Studies & Procedures     STRESS TESTS  MYOCARDIAL PERFUSION IMAGING 07/21/2017   ECHOCARDIOGRAM  ECHOCARDIOGRAM COMPLETE 07/19/2019  Narrative ECHOCARDIOGRAM REPORT    Patient Name:   Austin Bush Date of Exam: 07/18/2019 Medical Rec #:  [id number]      Height:       72.0 in Accession #:    [id number]     Weight:       235.0  lb Date of Birth:  [birth year]       BSA:          2.282 m Patient Age:    56 years       BP:           126/58 mmHg Patient Gender: M              HR:           75 bpm. Exam Location:  [facility]  Procedure: 2D Echo  Indications:    Chronic combined systolic and diastolic heart failure ([place]) [I50.42 (ICD-10-CM)]  History:        Patient has prior history  of Echocardiogram examinations, most recent 04/22/2014. Previous Myocardial Infarction, COPD, Arrythmias:PVC's, Signs/Symptoms:Class 1 obesity; Risk Factors:Hypertension, Dyslipidemia and Diabetes.  Sonographer:    Luane School Referring Phys: [phone] [place]   1. Left ventricular ejection fraction, by estimation, is 40 to 45%. The left ventricle has mildly decreased function. The left ventricle has no regional wall motion abnormalities. Left ventricular diastolic parameters are consistent with Grade I diastolic dysfunction (impaired relaxation). 2. Aortic moderate sclerosis with trace AR.  FINDINGS Left Ventricle: Left ventricular ejection fraction, by estimation, is 40 to 45%. The left ventricle has mildly decreased function. The left ventricle has no regional wall motion abnormalities. The left ventricular internal cavity size was normal in size. There is no left ventricular hypertrophy. Left ventricular diastolic parameters are consistent with Grade I diastolic dysfunction (impaired relaxation).  Right Ventricle: Artifact of pacemaker seen in right heart. The right ventricular size is normal. No increase in right ventricular wall thickness. Right ventricular systolic function is normal. There is mildly elevated pulmonary artery systolic pressure. The tricuspid regurgitant velocity is 2.73 m/s, and with an assumed right atrial pressure of 8 mmHg, the estimated right ventricular systolic pressure is [id number].  Left Atrium: Left atrial size was normal in size.  Right Atrium: Right atrial size was normal in  size.  Pericardium: There is no evidence of pericardial effusion.  Mitral Valve: The mitral valve is normal in structure. Normal mobility of the mitral valve leaflets. No evidence of mitral valve regurgitation. No evidence of mitral valve stenosis.  Tricuspid Valve: The tricuspid valve is normal in structure. Tricuspid valve regurgitation is not demonstrated. No evidence of tricuspid stenosis.  Aortic Valve: The aortic valve is normal in structure. Aortic valve regurgitation is trivial. Aortic regurgitation PHT measures 580 msec. Mild to moderate aortic stenosis is present.  Pulmonic Valve: The pulmonic valve was normal in structure. Pulmonic valve regurgitation is not visualized. No evidence of pulmonic stenosis.  Aorta: The aortic root is normal in size and structure.  Venous: The inferior vena cava is normal in size with greater than 50% respiratory variability, suggesting right atrial pressure of 3 mmHg.  IAS/Shunts: No atrial level shunt detected by color flow Doppler.   LEFT VENTRICLE PLAX 2D LVIDd:         5.60 cm      Diastology LVIDs:         4.00 cm      LV e' lateral:   9.46 cm/s LV PW:         1.10 cm      LV E/e' lateral: 8.7 LV IVS:        1.00 cm      LV e' medial:    6.85 cm/s LVOT diam:     2.00 cm      LV E/e' medial:  12.0 LV SV:         61 LV SV Index:   27 LVOT Area:     3.14 cm  LV Volumes (MOD) LV vol d, MOD A2C: 147.0 ml LV vol d, MOD A4C: 111.0 ml LV vol s, MOD A2C: 83.1 ml LV vol s, MOD A4C: 54.6 ml LV SV MOD A2C:     63.9 ml LV SV MOD A4C:     111.0 ml LV SV MOD BP:      61.6 ml  RIGHT VENTRICLE             IVC RV S prime:     13.60  cm/s  IVC diam: 2.00 cm TAPSE (M-mode): 2.8 cm  LEFT ATRIUM             Index       RIGHT ATRIUM           Index LA diam:        3.50 cm 1.53 cm/m  RA Area:     11.90 cm LA Vol (A2C):   51.7 ml 22.66 ml/m RA Volume:   27.90 ml  12.23 ml/m LA Vol (A4C):   36.8 ml 16.13 ml/m LA Biplane Vol: 42.5 ml 18.63  ml/m AORTIC VALVE LVOT Vmax:   85.73 cm/s LVOT Vmean:  61.667 cm/s LVOT VTI:    0.193 m AI PHT:      580 msec  AORTA Ao Root diam: 3.20 cm Ao Asc diam:  3.20 cm  MITRAL VALVE                TRICUSPID VALVE MV Area (PHT): 3.21 cm     TR Peak grad:   29.8 mmHg MV Decel Time: 236 msec     TR Vmax:        273.00 cm/s MV E velocity: 82.00 cm/s MV A velocity: 105.00 cm/s  SHUNTS MV E/A ratio:  0.78         Systemic VTI:  0.19 m Systemic Diam: 2.00 cm  Jyl Heinz MD Electronically signed by Jyl Heinz MD Signature Date/Time: 07/19/2019/3:36:14 PM    Final             EKG:  EKG ordered today and personally reviewed.  The ekg ordered today demonstrates ***  Recent Labs: No results found for requested labs within last 365 days.  Recent Lipid Panel    Component Value Date/Time   CHOL 182 10/07/2017 0911   TRIG 81 10/07/2017 0911   HDL 64 10/07/2017 0911   CHOLHDL 2.8 10/07/2017 0911   LDLCALC 102 (H) 10/07/2017 0911    Physical Exam:    VS:  There were no vitals taken for this visit.    Wt Readings from Last 3 Encounters:  10/01/21 237 lb (107.5 kg)  12/02/20 228 lb 12.8 oz (103.8 kg)  08/22/20 241 lb (109.3 kg)     GEN: *** Well nourished, well developed in no acute distress HEENT: Normal NECK: No JVD; No carotid bruits LYMPHATICS: No lymphadenopathy CARDIAC: ***RRR, no murmurs, rubs, gallops RESPIRATORY:  Clear to auscultation without rales, wheezing or rhonchi  ABDOMEN: Soft, non-tender, non-distended MUSCULOSKELETAL:  No edema; No deformity  SKIN: Warm and dry NEUROLOGIC:  Alert and oriented x 3 PSYCHIATRIC:  Normal affect    Signed, Shirlee More, MD  06/01/2022 1:32 PM    [facility]

## 2022-06-01 ENCOUNTER — Ambulatory Visit: Payer: Self-pay | Attending: Cardiology | Admitting: Cardiology

## 2022-06-02 ENCOUNTER — Encounter: Payer: Self-pay | Admitting: Cardiology

## 2023-07-09 IMAGING — CT CT ABD-PELV W/ CM
2 of 5 series · 16 of 46 positions shown, 18 images · IV contrast (Omni 300)
Comparison: 09/20/2020

CLINICAL DATA: Acute abdominal pain for several days with nausea
and vomiting

EXAM:
CT ABDOMEN AND PELVIS WITH CONTRAST
TECHNIQUE: Multidetector CT imaging of the abdomen and pelvis was performed
using the standard protocol following bolus administration of
intravenous contrast.
CONTRAST:  100mL OMNIPAQUE IOHEXOL 300 MG/ML  SOLN

[Series 3: a/p w/ 5mm · axial · 0.91mm/px · z∈[-441,+19]mm · 13 of 104 slices shown, 15 images]
[im 6/104  soft-tissue]
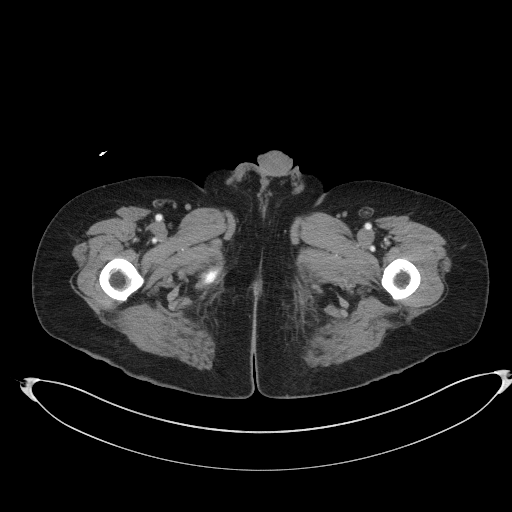
[im 6/104  bone]
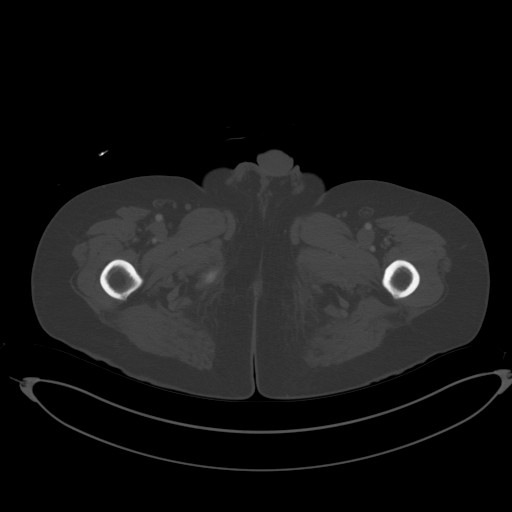
[im 16/104  soft-tissue]
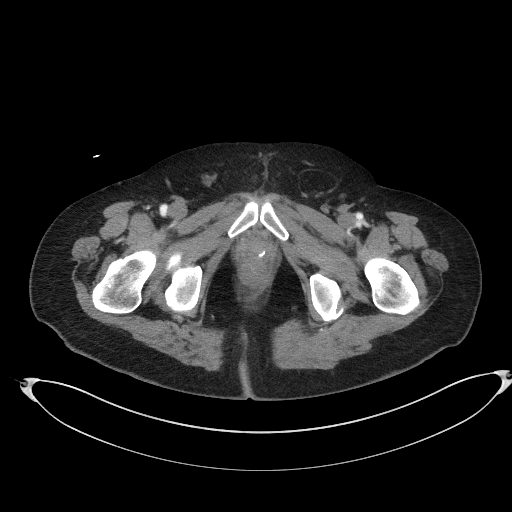
[im 21/104  soft-tissue]
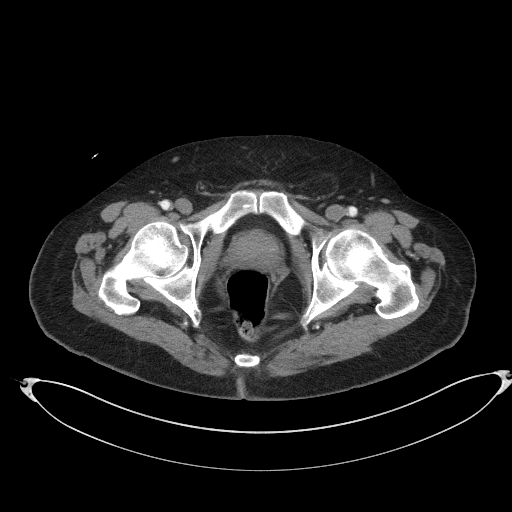
[im 31/104  soft-tissue]
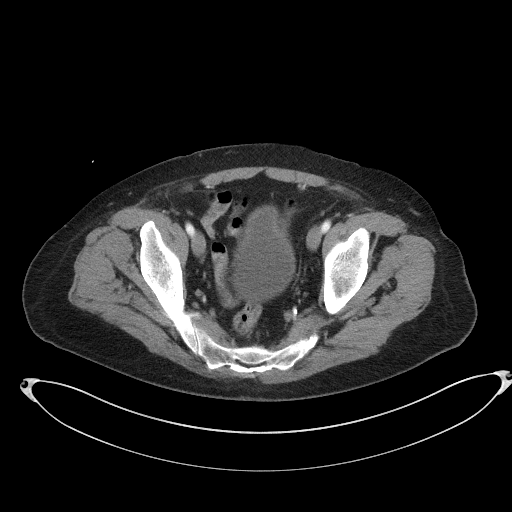
[im 37/104  soft-tissue]
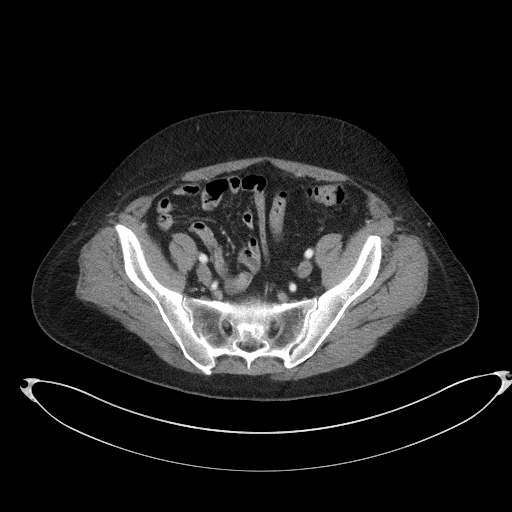
[im 47/104  soft-tissue]
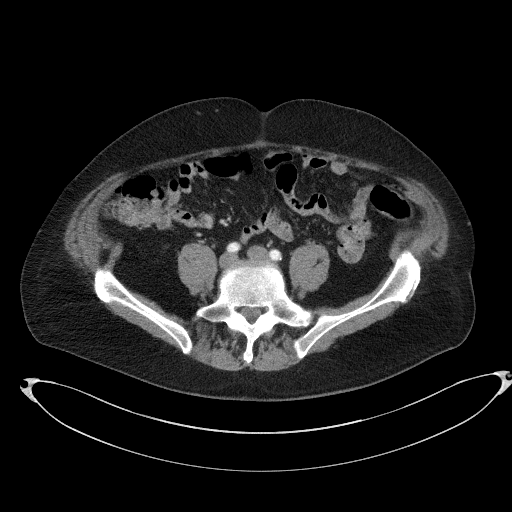
[im 52/104  soft-tissue]
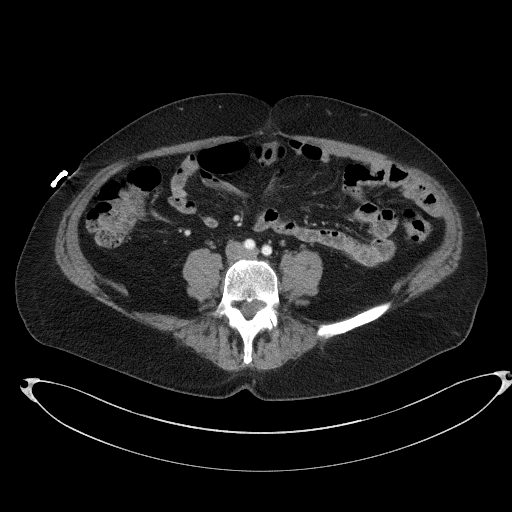
[im 57/104  soft-tissue]
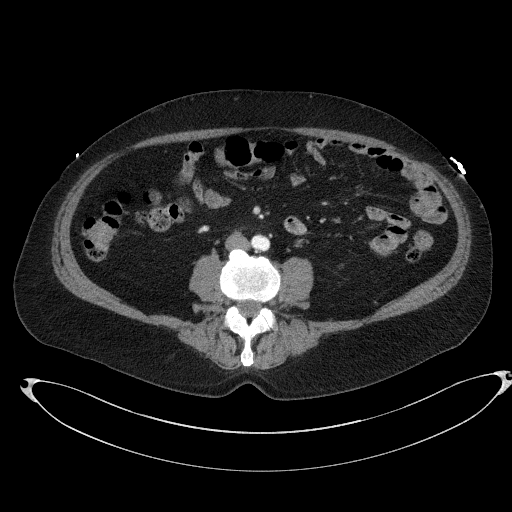
[im 67/104  soft-tissue]
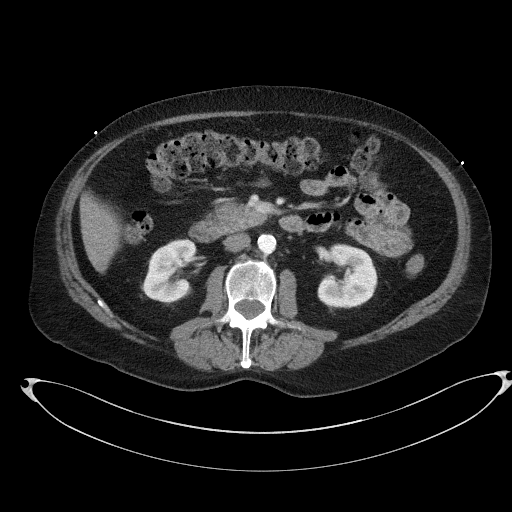
[im 67/104  bone]
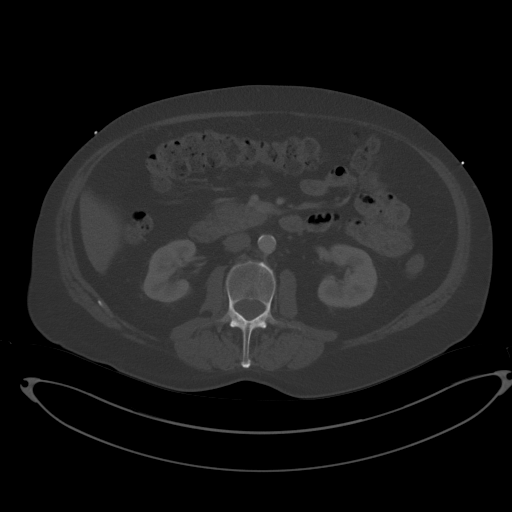
[im 73/104  soft-tissue]
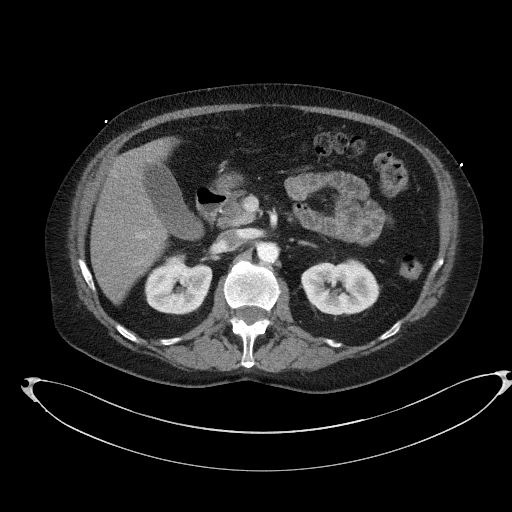
[im 83/104  soft-tissue]
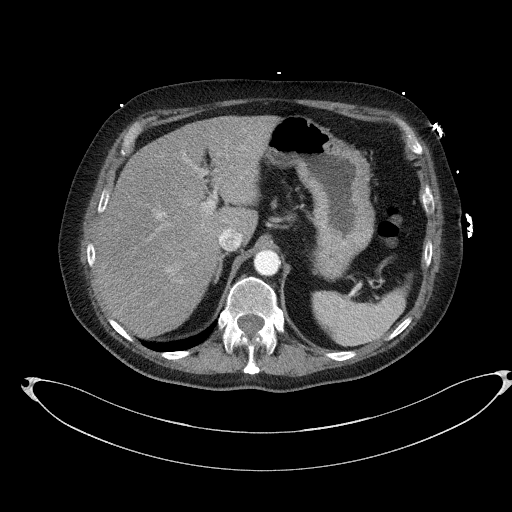
[im 88/104  soft-tissue]
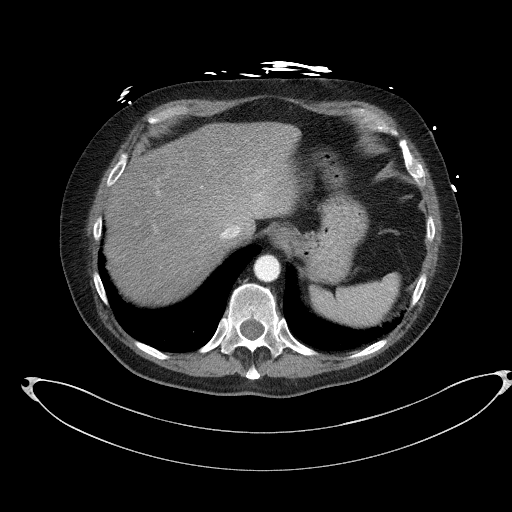
[im 98/104  soft-tissue]
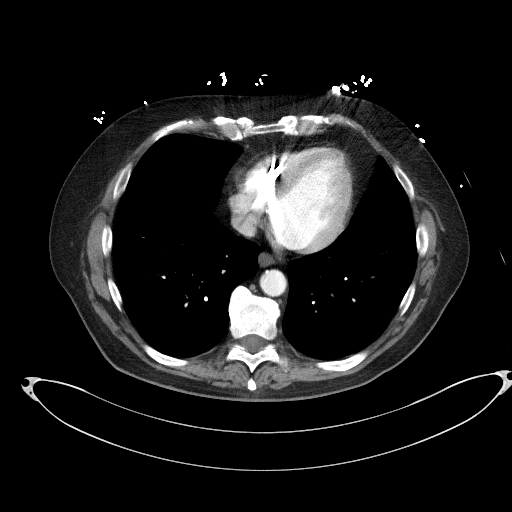

[Series 6: a/p w/ cor · coronal · 0.96mm/px · 3 of 168 slices shown]
[im 56/168  soft-tissue]
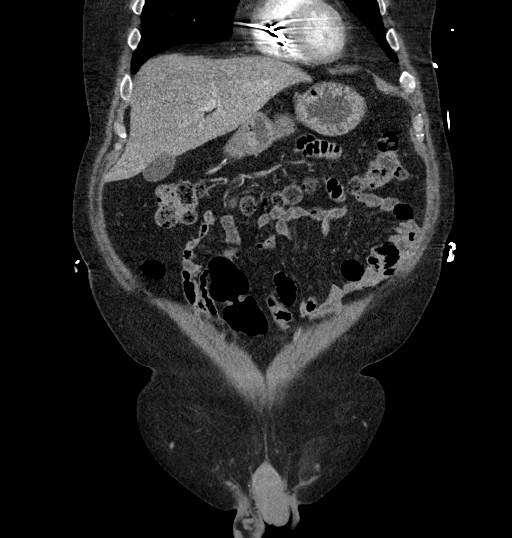
[im 75/168  soft-tissue]
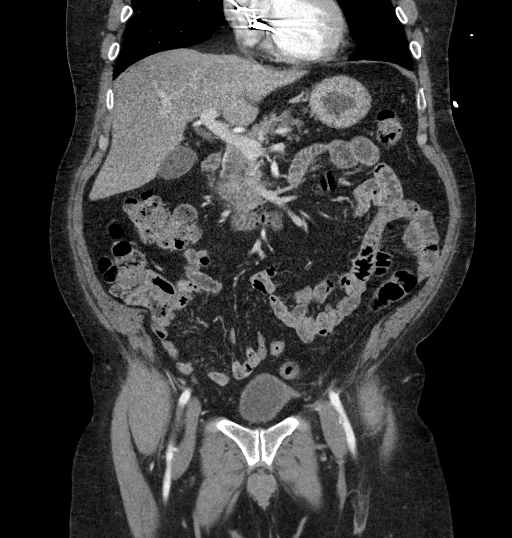
[im 93/168  soft-tissue]
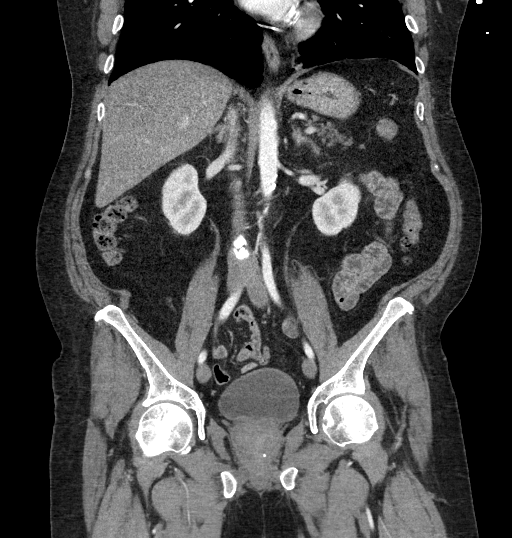

[16 of 46 positions shown; findings below may reference images not displayed]

FINDINGS: Lower chest: No acute abnormality.

Hepatobiliary: Fatty infiltration of the liver is noted. The
gallbladder is within normal limits. Tiny calcified granuloma is
noted in the left lobe of the liver stable from the prior exam.

Pancreas: Unremarkable. No pancreatic ductal dilatation or
surrounding inflammatory changes.

Spleen: Normal in size without focal abnormality.

Adrenals/Urinary Tract: Adrenal glands are within normal limits.
Kidneys demonstrate a normal enhancement pattern bilaterally. No
definitive renal calculi are seen. Tiny right renal cyst is noted.
No obstructive changes are seen. The bladder is well distended.

Stomach/Bowel: Minimal diverticular change of the colon is noted. No
evidence of diverticulitis is seen. The appendix is not well
visualized. No inflammatory changes to suggest appendicitis are
noted. Small bowel and stomach are within normal limits.

Vascular/Lymphatic: Aortic atherosclerosis. No enlarged abdominal or
pelvic lymph nodes.

Reproductive: Prostate is unremarkable.

Other: Fat containing left inguinal hernia is noted. Small fat
containing umbilical hernia is noted as well. No ascites is seen.

Musculoskeletal: No acute or significant osseous findings.
IMPRESSION: Previously seen abnormality in the pancreas is not Ro out on
today's exam. This was likely related to the scatter artifact from
contrast material in the stomach.

Fatty liver.

Diverticular change without diverticulitis.
